# Patient Record
Sex: Male | Born: 1960 | Race: White | Hispanic: No | Marital: Married | Smoking: Never smoker
Health system: Southern US, Community
[De-identification: ages and names within clinical notes are randomized; demographics above are authoritative.]

## PROBLEM LIST (undated history)

## (undated) DIAGNOSIS — I10 Essential (primary) hypertension: Secondary | ICD-10-CM

## (undated) DIAGNOSIS — I2510 Atherosclerotic heart disease of native coronary artery without angina pectoris: Secondary | ICD-10-CM

## (undated) DIAGNOSIS — I219 Acute myocardial infarction, unspecified: Secondary | ICD-10-CM

## (undated) DIAGNOSIS — I639 Cerebral infarction, unspecified: Secondary | ICD-10-CM

## (undated) DIAGNOSIS — I469 Cardiac arrest, cause unspecified: Secondary | ICD-10-CM

## (undated) DIAGNOSIS — M109 Gout, unspecified: Secondary | ICD-10-CM

## (undated) HISTORY — PX: BYPASS GRAFT: SHX909

---

## 2015-01-04 ENCOUNTER — Encounter (HOSPITAL_COMMUNITY): Payer: Self-pay | Admitting: Emergency Medicine

## 2015-01-04 ENCOUNTER — Observation Stay (HOSPITAL_COMMUNITY): Payer: Managed Care, Other (non HMO)

## 2015-01-04 ENCOUNTER — Observation Stay (HOSPITAL_COMMUNITY)
Admission: EM | Admit: 2015-01-04 | Discharge: 2015-01-10 | Disposition: A | Discharge status: Home / Self Care | Payer: Managed Care, Other (non HMO) | Attending: Internal Medicine | Admitting: Internal Medicine

## 2015-01-04 ENCOUNTER — Emergency Department (HOSPITAL_COMMUNITY): Payer: Managed Care, Other (non HMO)

## 2015-01-04 DIAGNOSIS — N19 Unspecified kidney failure: Secondary | ICD-10-CM | POA: Diagnosis not present

## 2015-01-04 DIAGNOSIS — E785 Hyperlipidemia, unspecified: Secondary | ICD-10-CM | POA: Insufficient documentation

## 2015-01-04 DIAGNOSIS — I639 Cerebral infarction, unspecified: Secondary | ICD-10-CM | POA: Diagnosis present

## 2015-01-04 DIAGNOSIS — I10 Essential (primary) hypertension: Secondary | ICD-10-CM | POA: Diagnosis present

## 2015-01-04 DIAGNOSIS — Z8674 Personal history of sudden cardiac arrest: Secondary | ICD-10-CM | POA: Insufficient documentation

## 2015-01-04 DIAGNOSIS — G40209 Localization-related (focal) (partial) symptomatic epilepsy and epileptic syndromes with complex partial seizures, not intractable, without status epilepticus: Secondary | ICD-10-CM | POA: Diagnosis not present

## 2015-01-04 DIAGNOSIS — Z6835 Body mass index (BMI) 35.0-35.9, adult: Secondary | ICD-10-CM | POA: Diagnosis not present

## 2015-01-04 DIAGNOSIS — Z951 Presence of aortocoronary bypass graft: Secondary | ICD-10-CM | POA: Insufficient documentation

## 2015-01-04 DIAGNOSIS — G459 Transient cerebral ischemic attack, unspecified: Principal | ICD-10-CM | POA: Diagnosis present

## 2015-01-04 DIAGNOSIS — R931 Abnormal findings on diagnostic imaging of heart and coronary circulation: Secondary | ICD-10-CM | POA: Insufficient documentation

## 2015-01-04 DIAGNOSIS — R4701 Aphasia: Secondary | ICD-10-CM | POA: Diagnosis present

## 2015-01-04 DIAGNOSIS — E669 Obesity, unspecified: Secondary | ICD-10-CM | POA: Diagnosis not present

## 2015-01-04 DIAGNOSIS — I252 Old myocardial infarction: Secondary | ICD-10-CM | POA: Insufficient documentation

## 2015-01-04 DIAGNOSIS — J329 Chronic sinusitis, unspecified: Secondary | ICD-10-CM | POA: Diagnosis present

## 2015-01-04 DIAGNOSIS — Z8249 Family history of ischemic heart disease and other diseases of the circulatory system: Secondary | ICD-10-CM | POA: Insufficient documentation

## 2015-01-04 DIAGNOSIS — I2510 Atherosclerotic heart disease of native coronary artery without angina pectoris: Secondary | ICD-10-CM | POA: Diagnosis not present

## 2015-01-04 DIAGNOSIS — I509 Heart failure, unspecified: Secondary | ICD-10-CM | POA: Diagnosis not present

## 2015-01-04 DIAGNOSIS — I420 Dilated cardiomyopathy: Secondary | ICD-10-CM | POA: Diagnosis not present

## 2015-01-04 DIAGNOSIS — Z79899 Other long term (current) drug therapy: Secondary | ICD-10-CM | POA: Insufficient documentation

## 2015-01-04 DIAGNOSIS — G40219 Localization-related (focal) (partial) symptomatic epilepsy and epileptic syndromes with complex partial seizures, intractable, without status epilepticus: Secondary | ICD-10-CM | POA: Insufficient documentation

## 2015-01-04 DIAGNOSIS — Z791 Long term (current) use of non-steroidal anti-inflammatories (NSAID): Secondary | ICD-10-CM | POA: Diagnosis not present

## 2015-01-04 HISTORY — DX: Acute myocardial infarction, unspecified: I21.9

## 2015-01-04 HISTORY — DX: Atherosclerotic heart disease of native coronary artery without angina pectoris: I25.10

## 2015-01-04 HISTORY — DX: Essential (primary) hypertension: I10

## 2015-01-04 HISTORY — DX: Cardiac arrest, cause unspecified: I46.9

## 2015-01-04 LAB — COMPREHENSIVE METABOLIC PANEL
ALBUMIN: 3.8 g/dL (ref 3.5–5.0)
ALT: 51 U/L (ref 17–63)
AST: 35 U/L (ref 15–41)
Alkaline Phosphatase: 131 U/L — ABNORMAL HIGH (ref 38–126)
Anion gap: 12 (ref 5–15)
BUN: 24 mg/dL — AB (ref 6–20)
CO2: 26 mmol/L (ref 22–32)
Calcium: 9.5 mg/dL (ref 8.9–10.3)
Chloride: 98 mmol/L — ABNORMAL LOW (ref 101–111)
Creatinine, Ser: 1.49 mg/dL — ABNORMAL HIGH (ref 0.61–1.24)
GFR calc Af Amer: 60 mL/min — ABNORMAL LOW (ref >60)
GFR calc non Af Amer: 52 mL/min — ABNORMAL LOW (ref >60)
Glucose, Bld: 121 mg/dL — ABNORMAL HIGH (ref 65–99)
Potassium: 4.2 mmol/L (ref 3.5–5.1)
Sodium: 136 mmol/L (ref 135–145)
Total Bilirubin: 1.2 mg/dL (ref 0.3–1.2)
Total Protein: 7.4 g/dL (ref 6.5–8.1)

## 2015-01-04 LAB — I-STAT CHEM 8, ED
BUN: 35 mg/dL — ABNORMAL HIGH (ref 6–20)
CALCIUM ION: 1.02 mmol/L — AB (ref 1.12–1.23)
CHLORIDE: 99 mmol/L — AB (ref 101–111)
Creatinine, Ser: 1.4 mg/dL — ABNORMAL HIGH (ref 0.61–1.24)
GLUCOSE: 121 mg/dL — AB (ref 65–99)
HCT: 56 % — ABNORMAL HIGH (ref 39.0–52.0)
Hemoglobin: 19 g/dL — ABNORMAL HIGH (ref 13.0–17.0)
Potassium: 4.4 mmol/L (ref 3.5–5.1)
Sodium: 137 mmol/L (ref 135–145)
TCO2: 26 mmol/L (ref 0–100)

## 2015-01-04 LAB — DIFFERENTIAL
BASOS ABS: 0 10*3/uL (ref 0.0–0.1)
Basophils Relative: 0 % (ref 0–1)
Eosinophils Absolute: 0.2 10*3/uL (ref 0.0–0.7)
Eosinophils Relative: 3 % (ref 0–5)
LYMPHS PCT: 27 % (ref 12–46)
Lymphs Abs: 1.9 10*3/uL (ref 0.7–4.0)
MONOS PCT: 8 % (ref 3–12)
Monocytes Absolute: 0.5 10*3/uL (ref 0.1–1.0)
NEUTROS ABS: 4.4 10*3/uL (ref 1.7–7.7)
Neutrophils Relative %: 62 % (ref 43–77)

## 2015-01-04 LAB — PROTIME-INR
INR: 1.12 (ref 0.00–1.49)
PROTHROMBIN TIME: 14.6 s (ref 11.6–15.2)

## 2015-01-04 LAB — CBC
HCT: 50.8 % (ref 39.0–52.0)
HEMOGLOBIN: 17.5 g/dL — AB (ref 13.0–17.0)
MCH: 30.5 pg (ref 26.0–34.0)
MCHC: 34.4 g/dL (ref 30.0–36.0)
MCV: 88.5 fL (ref 78.0–100.0)
Platelets: 308 10*3/uL (ref 150–400)
RBC: 5.74 MIL/uL (ref 4.22–5.81)
RDW: 13.3 % (ref 11.5–15.5)
WBC: 7 10*3/uL (ref 4.0–10.5)

## 2015-01-04 LAB — APTT: APTT: 30 s (ref 24–37)

## 2015-01-04 LAB — CBG MONITORING, ED: Glucose-Capillary: 119 mg/dL — ABNORMAL HIGH (ref 65–99)

## 2015-01-04 LAB — I-STAT TROPONIN, ED: Troponin i, poc: 0 ng/mL (ref 0.00–0.08)

## 2015-01-04 LAB — I-STAT CG4 LACTIC ACID, ED: LACTIC ACID, VENOUS: 1.82 mmol/L (ref 0.5–2.0)

## 2015-01-04 MED ORDER — CLOPIDOGREL BISULFATE 300 MG PO TABS
300.0000 mg | ORAL_TABLET | Freq: Every day | ORAL | Status: DC
  Administered 2015-01-04: 300 mg via ORAL

## 2015-01-04 MED ORDER — SODIUM CHLORIDE 0.9 % IV SOLN
INTRAVENOUS | Status: AC
  Administered 2015-01-05: via INTRAVENOUS

## 2015-01-04 MED ORDER — CLOPIDOGREL BISULFATE 75 MG PO TABS
75.0000 mg | ORAL_TABLET | Freq: Every day | ORAL | Status: DC
  Administered 2015-01-05 – 2015-01-10 (×6): 75 mg via ORAL
  Filled 2015-01-04 (×6): qty 1

## 2015-01-04 MED ORDER — ASPIRIN EC 325 MG PO TBEC
325.0000 mg | DELAYED_RELEASE_TABLET | Freq: Every day | ORAL | Status: DC
  Administered 2015-01-04 – 2015-01-10 (×7): 325 mg via ORAL
  Filled 2015-01-04 (×6): qty 1

## 2015-01-04 MED ORDER — LISINOPRIL 10 MG PO TABS
20.0000 mg | ORAL_TABLET | Freq: Every day | ORAL | Status: DC
  Administered 2015-01-05 – 2015-01-10 (×5): 20 mg via ORAL
  Filled 2015-01-04 (×2): qty 1
  Filled 2015-01-04: qty 2
  Filled 2015-01-04 (×2): qty 1
  Filled 2015-01-04: qty 2

## 2015-01-04 MED ORDER — ASPIRIN EC 325 MG PO TBEC
325.0000 mg | DELAYED_RELEASE_TABLET | Freq: Every day | ORAL | Status: DC
  Filled 2015-01-04: qty 1

## 2015-01-04 MED ORDER — SENNOSIDES-DOCUSATE SODIUM 8.6-50 MG PO TABS: 1.0000 | ORAL_TABLET | Freq: Every evening | ORAL | Status: DC | PRN

## 2015-01-04 MED ORDER — CLOPIDOGREL BISULFATE 300 MG PO TABS
300.0000 mg | ORAL_TABLET | Freq: Every day | ORAL | Status: DC
  Filled 2015-01-04: qty 1

## 2015-01-04 MED ORDER — ENOXAPARIN SODIUM 40 MG/0.4ML ~~LOC~~ SOLN
40.0000 mg | SUBCUTANEOUS | Status: DC
  Administered 2015-01-05 – 2015-01-08 (×5): 40 mg via SUBCUTANEOUS
  Filled 2015-01-04 (×6): qty 0.4

## 2015-01-04 MED ORDER — STROKE: EARLY STAGES OF RECOVERY BOOK
Freq: Once | Status: AC
Start: 2015-01-05 — End: 2015-01-05
  Administered 2015-01-05

## 2015-01-04 MED ORDER — IOHEXOL 350 MG/ML SOLN
80.0000 mL | Freq: Once | INTRAVENOUS | Status: AC | PRN
  Administered 2015-01-04: 100 mL via INTRAVENOUS

## 2015-01-04 MED ORDER — CLOPIDOGREL BISULFATE 300 MG PO TABS: 300.0000 mg | ORAL_TABLET | Freq: Once | ORAL | Status: AC

## 2015-01-04 NOTE — ED Notes (Signed)
Pt transported to MRI with Nehemiah Settle, RRT

## 2015-01-04 NOTE — H&P (Signed)
[facility] History and Physical  Darrell Knapp ZOX:[id number] DOB: 07-29-1960 DOA: 01/04/2015  Referring physician: ER physician. PCP: No primary care provider on file. patient is visiting from [place]. Specialists: None.  Chief Complaint: Right-sided weakness with aphasia and confusion.  HPI: Darrell Knapp is a 54 y.o. male with history of CAD status post CABG, cardiac arrest and hypertension was brought to the ER after patient was found to have right-sided weakness with aphasia and confusion which lasted for around 10 minutes. It happened around last evening 4 PM. On the way to the hospital patient also had a similar episode again which resolved without any intervention. Patient had an episode later again. CT head MRI brain and CT angiogram of the head and neck are unremarkable. Neurologist has seen patient in consult and patient was given loading dose of Plavix along with aspirin. Patient states he was recently started on lisinopril and HCTZ for his hypertension 3 weeks ago when patient had some chest discomfort. Presently has no chest discomfort. Denies any shortness of breath. Patient otherwise denies any visual symptoms difficulty swallowing. On exam patient is able to move all extremities.   Review of Systems: As presented in the history of presenting illness, rest negative.  Past Medical History  Diagnosis Date  . Coronary artery disease   . Cardiac arrest   . Heart attack   . Hypertension    Past Surgical History  Procedure Laterality Date  . Bypass graft     Social History:  reports that he has never smoked. He does not have any smokeless tobacco history on file. He reports that he does not drink alcohol. His drug history is not on file. Where does patient live home.  Can patient participate in ADLs? Yes.  No Known Allergies  Family History:  Family History  Problem Relation Age of Onset  . Stroke Mother   . Heart attack Father   . Kidney failure Brother        Prior to Admission medications   Medication Sig Start Date End Date Taking? Authorizing Provider  lisinopril-hydrochlorothiazide (PRINZIDE,ZESTORETIC) 20-25 MG per tablet Take 1 tablet by mouth daily. 12/16/14  Yes Historical Provider, MD    Physical Exam: Filed Vitals:   01/04/15 2000 01/04/15 2009 01/04/15 2138 01/04/15 2300  BP: 158/105  138/98   Pulse: 98  95   Temp:  98.4 F (36.9 C)    TempSrc:   Oral   Resp: 17  18   Height:    5\' 10"  (1.778 m)  Weight:    114.896 kg (253 lb 4.8 oz)  SpO2: 93%  98%      General:  Moderately built and nourished.  Eyes: Anicteric no pallor.  ENT: No discharge from the ears eyes nose and mouth.  Neck: No mass felt. No neck rigidity.  Cardiovascular: S1-S2 heard.  Respiratory: No rhonchi or crepitations.  Abdomen: Soft nontender bowel sounds present.  Skin: No rash.  Musculoskeletal: No edema.  Psychiatric: Appears normal.  Neurologic: Alert awake oriented to time place and person. Moves all extremities 5 x 5. No facial asymmetry. Tongue is midline.  Labs on Admission:  Basic Metabolic Panel:  Recent Labs Lab 01/04/15 1752 01/04/15 1800  NA 137 136  K 4.4 4.2  CL 99* 98*  CO2  --  26  GLUCOSE 121* 121*  BUN 35* 24*  CREATININE 1.40* 1.49*  CALCIUM  --  9.5   Liver Function Tests:  Recent Labs Lab 01/04/15 1800  AST 35  ALT 51  ALKPHOS 131*  BILITOT 1.2  PROT 7.4  ALBUMIN 3.8   No results for input(s): LIPASE, AMYLASE in the last 168 hours. No results for input(s): AMMONIA in the last 168 hours. CBC:  Recent Labs Lab 01/04/15 1752 01/04/15 1800  WBC  --  7.0  NEUTROABS  --  4.4  HGB 19.0* 17.5*  HCT 56.0* 50.8  MCV  --  88.5  PLT  --  308   Cardiac Enzymes: No results for input(s): CKTOTAL, CKMB, CKMBINDEX, TROPONINI in the last 168 hours.  BNP (last 3 results) No results for input(s): BNP in the last 8760 hours.  ProBNP (last 3 results) No results for input(s): PROBNP in the last 8760  hours.  CBG:  Recent Labs Lab 01/04/15 1831  GLUCAP 119*    Radiological Exams on Admission: Ct Angio Head W/cm &/or Wo Cm  01/04/2015   CLINICAL DATA:  Episode lasting 45 seconds with acute onset weakness in the right upper and lower extremities. Persistent intermittent aphasia  EXAM: CT ANGIOGRAPHY HEAD AND NECK  TECHNIQUE: Multidetector CT imaging of the head and neck was performed using the standard protocol during bolus administration of intravenous contrast. Multiplanar CT image reconstructions and MIPs were obtained to evaluate the vascular anatomy. Carotid stenosis measurements (when applicable) are obtained utilizing NASCET criteria, using the distal internal carotid diameter as the denominator.  CONTRAST:  OMNIPAQUE IOHEXOL 350 MG/ML SOLN  COMPARISON:  CT head without contrast from the same day.  FINDINGS: CT HEAD  Brain: The source images demonstrate no evidence for acute or subacute infarction. Basal ganglia are intact. The insular ribbon is intact. No acute cortical infarct is present.  Calvarium and skull base: Within normal limits.  Paranasal sinuses: Mild mucosal thickening is present in the frontal sinuses bilaterally. The paranasal sinuses and mastoid air cells are otherwise clear.  Orbits: Negative  CTA NECK  Aortic arch: A common origin of the left common carotid artery and innominate artery is noted. The study is mildly degraded by patient motion.  Right carotid system: The right common carotid artery is within normal limits. Atherosclerotic calcifications are present at the origin of the right internal carotid artery without significant stenosis. The cervical right ICA is normal.  Left carotid system: The left common carotid artery is within normal limits. Atherosclerotic changes are present at the carotid bifurcation without a significant stenosis relative to the more distal vessel. The more distal cervical ICA is normal.  Vertebral arteries:The vertebral arteries originate  from of the subclavian arteries bilaterally. Mild to moderate narrowing is present at proximal left vertebral artery. No focal stenosis or vascular injury is evident within the vertebral arteries.  Skeleton: Mild endplate degenerative changes are present from C3-4 through C5-6. Uncovertebral spurring is worse left than right at C3-4 and C5-6. No focal lytic or blastic lesions are present.  Other neck: The soft tissues of the neck demonstrate calcifications in the palatine tonsils bilaterally compatible with the history of infection. Lymph nodes are present in the parotid glands bilaterally. Reactive size level 2 and level 3 lymph nodes are present bilaterally. No significant cervical adenopathy is present.  The lung apices are clear  CTA HEAD  Anterior circulation: Minimal atherosclerotic changes are present within the cavernous internal carotid arteries bilaterally without significant stenosis through the ICA termini. The A1 and M1 segments are normal. The MCA bifurcations are intact. There is some attenuation of distal MCA branch vessels, worse on the right.  Posterior circulation: The  vertebral arteries are codominant. PICA origins are visualized and normal bilaterally. A fetal type left posterior cerebral artery is present. The right posterior cerebral artery originates from the basilar tip.  Venous sinuses: The dural sinuses are patent. The straight sinus and deep cerebral veins are intact. Cortical veins are within normal limits.  Anatomic variants: Fetal type left posterior cerebral artery.  Delayed phase: The postcontrast images demonstrate no pathologic enhancement.  IMPRESSION: 1. White matter disease without evidence for acute cortical infarct. 2. Mild attenuation of MCA branch vessels, right greater than left. 3. No significant proximal stenosis, aneurysm, or branch vessel occlusion. 4. Mild atherosclerotic changes within the carotid bifurcations bilaterally without significant stenosis. 5. Degenerative  changes in the cervical spine.   Electronically Signed   By: Marin Roberts M.D.   On: 01/04/2015 21:33   Ct Head Wo Contrast  01/04/2015   CLINICAL DATA:  Right-sided weakness and aphasia.  Code stroke.  EXAM: CT HEAD WITHOUT CONTRAST  TECHNIQUE: Contiguous axial images were obtained from the base of the skull through the vertex without intravenous contrast.  COMPARISON:  None.  FINDINGS: No acute cortical infarct, hemorrhage, or mass lesion is present. The ventricles are of normal size. No significant extra-axial fluid collection is evident. Focal opacity in the superior left frontal lobe appears separated by septation. There is surrounding reactive bone change. Paranasal sinuses are otherwise clear. The mastoid air cells are clear.  ASPECTS score = 10/10  [place] Stroke Program Early CT Score  Normal score = 10  IMPRESSION: 1. Normal CT appearance the brain. 2. Opacification of the superior aspect of the left frontal sinus with surrounding reactive bone change. This appears to be a chronic process. Recommend correlation with the patient's symptoms.  These results were called by telephone at the time of interpretation on 01/04/2015 at 6:17 pm to Dr. Noel Christmas , who verbally acknowledged these results.   Electronically Signed   By: Marin Roberts M.D.   On: 01/04/2015 18:29   Ct Angio Neck W/cm &/or Wo/cm  01/04/2015   CLINICAL DATA:  Episode lasting 45 seconds with acute onset weakness in the right upper and lower extremities. Persistent intermittent aphasia  EXAM: CT ANGIOGRAPHY HEAD AND NECK  TECHNIQUE: Multidetector CT imaging of the head and neck was performed using the standard protocol during bolus administration of intravenous contrast. Multiplanar CT image reconstructions and MIPs were obtained to evaluate the vascular anatomy. Carotid stenosis measurements (when applicable) are obtained utilizing NASCET criteria, using the distal internal carotid diameter as the denominator.   CONTRAST:  OMNIPAQUE IOHEXOL 350 MG/ML SOLN  COMPARISON:  CT head without contrast from the same day.  FINDINGS: CT HEAD  Brain: The source images demonstrate no evidence for acute or subacute infarction. Basal ganglia are intact. The insular ribbon is intact. No acute cortical infarct is present.  Calvarium and skull base: Within normal limits.  Paranasal sinuses: Mild mucosal thickening is present in the frontal sinuses bilaterally. The paranasal sinuses and mastoid air cells are otherwise clear.  Orbits: Negative  CTA NECK  Aortic arch: A common origin of the left common carotid artery and innominate artery is noted. The study is mildly degraded by patient motion.  Right carotid system: The right common carotid artery is within normal limits. Atherosclerotic calcifications are present at the origin of the right internal carotid artery without significant stenosis. The cervical right ICA is normal.  Left carotid system: The left common carotid artery is within normal limits. Atherosclerotic changes  are present at the carotid bifurcation without a significant stenosis relative to the more distal vessel. The more distal cervical ICA is normal.  Vertebral arteries:The vertebral arteries originate from of the subclavian arteries bilaterally. Mild to moderate narrowing is present at proximal left vertebral artery. No focal stenosis or vascular injury is evident within the vertebral arteries.  Skeleton: Mild endplate degenerative changes are present from C3-4 through C5-6. Uncovertebral spurring is worse left than right at C3-4 and C5-6. No focal lytic or blastic lesions are present.  Other neck: The soft tissues of the neck demonstrate calcifications in the palatine tonsils bilaterally compatible with the history of infection. Lymph nodes are present in the parotid glands bilaterally. Reactive size level 2 and level 3 lymph nodes are present bilaterally. No significant cervical adenopathy is present.  The lung  apices are clear  CTA HEAD  Anterior circulation: Minimal atherosclerotic changes are present within the cavernous internal carotid arteries bilaterally without significant stenosis through the ICA termini. The A1 and M1 segments are normal. The MCA bifurcations are intact. There is some attenuation of distal MCA branch vessels, worse on the right.  Posterior circulation: The vertebral arteries are codominant. PICA origins are visualized and normal bilaterally. A fetal type left posterior cerebral artery is present. The right posterior cerebral artery originates from the basilar tip.  Venous sinuses: The dural sinuses are patent. The straight sinus and deep cerebral veins are intact. Cortical veins are within normal limits.  Anatomic variants: Fetal type left posterior cerebral artery.  Delayed phase: The postcontrast images demonstrate no pathologic enhancement.  IMPRESSION: 1. White matter disease without evidence for acute cortical infarct. 2. Mild attenuation of MCA branch vessels, right greater than left. 3. No significant proximal stenosis, aneurysm, or branch vessel occlusion. 4. Mild atherosclerotic changes within the carotid bifurcations bilaterally without significant stenosis. 5. Degenerative changes in the cervical spine.   Electronically Signed   By: Marin Roberts M.D.   On: 01/04/2015 21:33   Mr Brain Wo Contrast  01/04/2015   CLINICAL DATA:  Acute onset of intermittent aphasia. Episode of right hemiparesis.  EXAM: MRI HEAD WITHOUT CONTRAST  TECHNIQUE: Multiplanar, multiecho pulse sequences of the brain and surrounding structures were obtained without intravenous contrast.  COMPARISON:  CTA head and neck from the same day.  FINDINGS: The diffusion-weighted images demonstrate no evidence for acute or subacute infarction. Mild atrophy and white matter change is slightly advanced for age.  The ventricles are of normal size. A remote left temporal parietal lobe infarct is noted. No significant  extraaxial fluid collection is present.  Flow is present in the major intracranial arteries. The globes and orbits are intact. Mild mucosal thickening is present in the anterior ethmoid air cells and frontal sinuses. A sequestered area in the superior left frontal sinus is opacified with osseous changes overlying this area, likely reactive.  The skullbase is within normal limits. Midline structures are unremarkable.  IMPRESSION: 1. No acute intracranial abnormality. 2. Age advanced atrophy and white matter disease. 3. 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 4. Frontal sinus disease.   Electronically Signed   By: Marin Roberts M.D.   On: 01/04/2015 21:36    EKG: Independently reviewed. Sinus tachycardia with RBBB.  Assessment/Plan Principal Problem:   TIA (transient ischemic attack) Active Problems:   Hypertension   CAD (coronary artery disease)   Renal failure   1. TIA - appreciate neurology consult. MRI of the brain and CT angiogram of the head and  neck are unremarkable. Patient will be placed on neurochecks swallow evaluation. Patient is on both aspirin and Plavix as recommended by neurologist. Check hemoglobin A1c and lipid panel. 2. Hypertension - continue lisinopril hold HCTZ as patient is receiving gentle hydration. 3. History of CAD status post CABG - denies any chest pain. On aspirin and Plavix presently. 4. Mild renal failure - baseline creatinine not known. Follow metabolic panel.  I have reviewed neurologist notes and recommendations.  DVT PLovenox.  Code Status:  Full code.  Family Communication:  Discussed with patient.  Disposition Plan:  Admit for observation.    Lynnann Knudsen N. [facility] Pager [phone].  If 7PM-7AM, please contact night-coverage [email or web address] Password [email or web address] 01/04/2015, 11:58 PM

## 2015-01-04 NOTE — ED Notes (Signed)
Neurology and stroke team at bedside. 

## 2015-01-04 NOTE — ED Notes (Signed)
Pt returned from MRI. Spoke with Dr. Amada Jupiter, pt reset the clock, returned to normal. NIH is 0. Repage out code stroke if new neuro symptoms, q2H neuro checks.

## 2015-01-04 NOTE — ED Provider Notes (Signed)
CSN: [id number]     Arrival date & time 01/04/15  1743 History   First MD Initiated Contact with Patient 01/04/15 1750     Chief Complaint  Patient presents with  . Code Stroke    An emergency department physician performed an initial assessment on this suspected stroke patient at 14. (Consider location/radiation/quality/duration/timing/severity/associated sxs/prior Treatment) Patient is a 54 y.o. male presenting with Acute Neurological Problem. The history is provided by the patient.  Cerebrovascular Accident This is a new problem. The current episode started 1 to 2 hours ago. The problem occurs constantly. The problem has been resolved (waxing and waning, 3 episodes total). Pertinent negatives include no chest pain and no shortness of breath. Associated symptoms comments: Right arm weakness, trembling of left hand, speech difficulty/word finding difficulty. Nothing aggravates the symptoms. Nothing relieves the symptoms. He has tried nothing for the symptoms.    Past Medical History  Diagnosis Date  . Coronary artery disease   . Cardiac arrest   . Heart attack   . Hypertension    Past Surgical History  Procedure Laterality Date  . Bypass graft     No family history on file. Social History  Substance Use Topics  . Smoking status: Never Smoker   . Smokeless tobacco: None  . Alcohol Use: No    Review of Systems  Respiratory: Negative for shortness of breath.   Cardiovascular: Negative for chest pain.  All other systems reviewed and are negative.     Allergies  Review of patient's allergies indicates no known allergies.  Home Medications   Prior to Admission medications   Not on File   BP 150/102 mmHg  Pulse 95  Temp(Src) 98.6 F (37 C) (Oral)  Resp 25  Wt 259 lb 11.2 oz (117.8 kg)  SpO2 98% Physical Exam  Constitutional: He is oriented to person, place, and time. He appears well-developed and well-nourished. No distress.  HENT:  Head: Normocephalic and  atraumatic.  Eyes: Conjunctivae are normal.  Neck: Neck supple. No tracheal deviation present.  Cardiovascular: Normal rate, regular rhythm and normal heart sounds.   Pulmonary/Chest: Effort normal and breath sounds normal. No respiratory distress.  Abdominal: Soft. He exhibits no distension.  Neurological: He is alert and oriented to person, place, and time. He has normal strength. No cranial nerve deficit. Coordination normal. GCS eye subscore is 4. GCS verbal subscore is 5. GCS motor subscore is 6.  Skin: Skin is warm and dry.  Psychiatric: He has a normal mood and affect. His speech is normal and behavior is normal.    ED Course  Procedures (including critical care time) Labs Review Labs Reviewed  CBC - Abnormal; Notable for the following:    Hemoglobin 17.5 (*)    All other components within normal limits  COMPREHENSIVE METABOLIC PANEL - Abnormal; Notable for the following:    Chloride 98 (*)    Glucose, Bld 121 (*)    BUN 24 (*)    Creatinine, Ser 1.49 (*)    Alkaline Phosphatase 131 (*)    GFR calc non Af Amer 52 (*)    GFR calc Af Amer 60 (*)    All other components within normal limits  CBG MONITORING, ED - Abnormal; Notable for the following:    Glucose-Capillary 119 (*)    All other components within normal limits  I-STAT CHEM 8, ED - Abnormal; Notable for the following:    Chloride 99 (*)    BUN 35 (*)    Creatinine,  Ser 1.40 (*)    Glucose, Bld 121 (*)    Calcium, Ion 1.02 (*)    Hemoglobin 19.0 (*)    HCT 56.0 (*)    All other components within normal limits  PROTIME-INR  APTT  DIFFERENTIAL  I-STAT TROPOININ, ED  I-STAT CG4 LACTIC ACID, ED    Imaging Review Ct Angio Head W/cm &/or Wo Cm  01/04/2015   CLINICAL DATA:  Episode lasting 45 seconds with acute onset weakness in the right upper and lower extremities. Persistent intermittent aphasia  EXAM: CT ANGIOGRAPHY HEAD AND NECK  TECHNIQUE: Multidetector CT imaging of the head and neck was performed using  the standard protocol during bolus administration of intravenous contrast. Multiplanar CT image reconstructions and MIPs were obtained to evaluate the vascular anatomy. Carotid stenosis measurements (when applicable) are obtained utilizing NASCET criteria, using the distal internal carotid diameter as the denominator.  CONTRAST:  OMNIPAQUE IOHEXOL 350 MG/ML SOLN  COMPARISON:  CT head without contrast from the same day.  FINDINGS: CT HEAD  Brain: The source images demonstrate no evidence for acute or subacute infarction. Basal ganglia are intact. The insular ribbon is intact. No acute cortical infarct is present.  Calvarium and skull base: Within normal limits.  Paranasal sinuses: Mild mucosal thickening is present in the frontal sinuses bilaterally. The paranasal sinuses and mastoid air cells are otherwise clear.  Orbits: Negative  CTA NECK  Aortic arch: A common origin of the left common carotid artery and innominate artery is noted. The study is mildly degraded by patient motion.  Right carotid system: The right common carotid artery is within normal limits. Atherosclerotic calcifications are present at the origin of the right internal carotid artery without significant stenosis. The cervical right ICA is normal.  Left carotid system: The left common carotid artery is within normal limits. Atherosclerotic changes are present at the carotid bifurcation without a significant stenosis relative to the more distal vessel. The more distal cervical ICA is normal.  Vertebral arteries:The vertebral arteries originate from of the subclavian arteries bilaterally. Mild to moderate narrowing is present at proximal left vertebral artery. No focal stenosis or vascular injury is evident within the vertebral arteries.  Skeleton: Mild endplate degenerative changes are present from C3-4 through C5-6. Uncovertebral spurring is worse left than right at C3-4 and C5-6. No focal lytic or blastic lesions are present.  Other neck:  The soft tissues of the neck demonstrate calcifications in the palatine tonsils bilaterally compatible with the history of infection. Lymph nodes are present in the parotid glands bilaterally. Reactive size level 2 and level 3 lymph nodes are present bilaterally. No significant cervical adenopathy is present.  The lung apices are clear  CTA HEAD  Anterior circulation: Minimal atherosclerotic changes are present within the cavernous internal carotid arteries bilaterally without significant stenosis through the ICA termini. The A1 and M1 segments are normal. The MCA bifurcations are intact. There is some attenuation of distal MCA branch vessels, worse on the right.  Posterior circulation: The vertebral arteries are codominant. PICA origins are visualized and normal bilaterally. A fetal type left posterior cerebral artery is present. The right posterior cerebral artery originates from the basilar tip.  Venous sinuses: The dural sinuses are patent. The straight sinus and deep cerebral veins are intact. Cortical veins are within normal limits.  Anatomic variants: Fetal type left posterior cerebral artery.  Delayed phase: The postcontrast images demonstrate no pathologic enhancement.  IMPRESSION: 1. White matter disease without evidence for acute cortical infarct.  2. Mild attenuation of MCA branch vessels, right greater than left. 3. No significant proximal stenosis, aneurysm, or branch vessel occlusion. 4. Mild atherosclerotic changes within the carotid bifurcations bilaterally without significant stenosis. 5. Degenerative changes in the cervical spine.   Electronically Signed   By: Marin Roberts M.D.   On: 01/04/2015 21:33   Ct Head Wo Contrast  01/04/2015   CLINICAL DATA:  Right-sided weakness and aphasia.  Code stroke.  EXAM: CT HEAD WITHOUT CONTRAST  TECHNIQUE: Contiguous axial images were obtained from the base of the skull through the vertex without intravenous contrast.  COMPARISON:  None.  FINDINGS: No  acute cortical infarct, hemorrhage, or mass lesion is present. The ventricles are of normal size. No significant extra-axial fluid collection is evident. Focal opacity in the superior left frontal lobe appears separated by septation. There is surrounding reactive bone change. Paranasal sinuses are otherwise clear. The mastoid air cells are clear.  ASPECTS score = 10/10  [place] Stroke Program Early CT Score  Normal score = 10  IMPRESSION: 1. Normal CT appearance the brain. 2. Opacification of the superior aspect of the left frontal sinus with surrounding reactive bone change. This appears to be a chronic process. Recommend correlation with the patient's symptoms.  These results were called by telephone at the time of interpretation on 01/04/2015 at 6:17 pm to Dr. Noel Christmas , who verbally acknowledged these results.   Electronically Signed   By: Marin Roberts M.D.   On: 01/04/2015 18:29   Ct Angio Neck W/cm &/or Wo/cm  01/04/2015   CLINICAL DATA:  Episode lasting 45 seconds with acute onset weakness in the right upper and lower extremities. Persistent intermittent aphasia  EXAM: CT ANGIOGRAPHY HEAD AND NECK  TECHNIQUE: Multidetector CT imaging of the head and neck was performed using the standard protocol during bolus administration of intravenous contrast. Multiplanar CT image reconstructions and MIPs were obtained to evaluate the vascular anatomy. Carotid stenosis measurements (when applicable) are obtained utilizing NASCET criteria, using the distal internal carotid diameter as the denominator.  CONTRAST:  OMNIPAQUE IOHEXOL 350 MG/ML SOLN  COMPARISON:  CT head without contrast from the same day.  FINDINGS: CT HEAD  Brain: The source images demonstrate no evidence for acute or subacute infarction. Basal ganglia are intact. The insular ribbon is intact. No acute cortical infarct is present.  Calvarium and skull base: Within normal limits.  Paranasal sinuses: Mild mucosal thickening is present in  the frontal sinuses bilaterally. The paranasal sinuses and mastoid air cells are otherwise clear.  Orbits: Negative  CTA NECK  Aortic arch: A common origin of the left common carotid artery and innominate artery is noted. The study is mildly degraded by patient motion.  Right carotid system: The right common carotid artery is within normal limits. Atherosclerotic calcifications are present at the origin of the right internal carotid artery without significant stenosis. The cervical right ICA is normal.  Left carotid system: The left common carotid artery is within normal limits. Atherosclerotic changes are present at the carotid bifurcation without a significant stenosis relative to the more distal vessel. The more distal cervical ICA is normal.  Vertebral arteries:The vertebral arteries originate from of the subclavian arteries bilaterally. Mild to moderate narrowing is present at proximal left vertebral artery. No focal stenosis or vascular injury is evident within the vertebral arteries.  Skeleton: Mild endplate degenerative changes are present from C3-4 through C5-6. Uncovertebral spurring is worse left than right at C3-4 and C5-6. No focal lytic  or blastic lesions are present.  Other neck: The soft tissues of the neck demonstrate calcifications in the palatine tonsils bilaterally compatible with the history of infection. Lymph nodes are present in the parotid glands bilaterally. Reactive size level 2 and level 3 lymph nodes are present bilaterally. No significant cervical adenopathy is present.  The lung apices are clear  CTA HEAD  Anterior circulation: Minimal atherosclerotic changes are present within the cavernous internal carotid arteries bilaterally without significant stenosis through the ICA termini. The A1 and M1 segments are normal. The MCA bifurcations are intact. There is some attenuation of distal MCA branch vessels, worse on the right.  Posterior circulation: The vertebral arteries are codominant.  PICA origins are visualized and normal bilaterally. A fetal type left posterior cerebral artery is present. The right posterior cerebral artery originates from the basilar tip.  Venous sinuses: The dural sinuses are patent. The straight sinus and deep cerebral veins are intact. Cortical veins are within normal limits.  Anatomic variants: Fetal type left posterior cerebral artery.  Delayed phase: The postcontrast images demonstrate no pathologic enhancement.  IMPRESSION: 1. White matter disease without evidence for acute cortical infarct. 2. Mild attenuation of MCA branch vessels, right greater than left. 3. No significant proximal stenosis, aneurysm, or branch vessel occlusion. 4. Mild atherosclerotic changes within the carotid bifurcations bilaterally without significant stenosis. 5. Degenerative changes in the cervical spine.   Electronically Signed   By: Marin Roberts M.D.   On: 01/04/2015 21:33   Mr Brain Wo Contrast  01/04/2015   CLINICAL DATA:  Acute onset of intermittent aphasia. Episode of right hemiparesis.  EXAM: MRI HEAD WITHOUT CONTRAST  TECHNIQUE: Multiplanar, multiecho pulse sequences of the brain and surrounding structures were obtained without intravenous contrast.  COMPARISON:  CTA head and neck from the same day.  FINDINGS: The diffusion-weighted images demonstrate no evidence for acute or subacute infarction. Mild atrophy and white matter change is slightly advanced for age.  The ventricles are of normal size. A remote left temporal parietal lobe infarct is noted. No significant extraaxial fluid collection is present.  Flow is present in the major intracranial arteries. The globes and orbits are intact. Mild mucosal thickening is present in the anterior ethmoid air cells and frontal sinuses. A sequestered area in the superior left frontal sinus is opacified with osseous changes overlying this area, likely reactive.  The skullbase is within normal limits. Midline structures are  unremarkable.  IMPRESSION: 1. No acute intracranial abnormality. 2. Age advanced atrophy and white matter disease. 3. 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 4. Frontal sinus disease.   Electronically Signed   By: Marin Roberts M.D.   On: 01/04/2015 21:36   I independently viewed and interpreted the above radiology studies and agree with radiologist report.   EKG Interpretation   Date/Time:  Wednesday January 04 2015 18:22:38 EDT Ventricular Rate:  101 PR Interval:  135 QRS Duration: 162 QT Interval:  380 QTC Calculation: 493 R Axis:   153 Text Interpretation:  Sinus tachycardia RBBB and LPFB Nonspecific T wave  abnormality Anterior leads No previous ECGs available Confirmed by Euan Wandler  MD, Reuel Boom ([id number]) on 01/04/2015 6:42:13 PM      MDM   Final diagnoses:  TIA (transient ischemic attack)    54 year old male comes in with intermittent weakness and difficulty. Presented as code stroke with persistent symptoms but resolved shortly after arrival. CT head negative, no acute metabolic abnormalities to explain her symptoms. Patient under a lot of  stress at work, neurology recommending admission for TIA workup. Hospitalist was consulted for admission and will see the patient in the emergency department.     Lyndal Pulley, MD 01/04/15 [phone]

## 2015-01-04 NOTE — ED Notes (Signed)
Patients LSN [id number]

## 2015-01-04 NOTE — ED Notes (Signed)
Patient unable to give any answer to any question other than his name. Unable to state the year, or where he was, pt visibly anxious. Full NIH done, no other neuro deficits. Repaged code stroke.

## 2015-01-04 NOTE — ED Notes (Signed)
Per [facility], pt suddenly lost the use of his R arm and leg, stated it felt heavy, episode lasted for 45 seconds, upon ems arrival, pt had no unilateral weakness but had intermittent periods of aphasia. Pt states he feels pretty normal now. Pt is AAOX4, in NAD. Pt denies pain, denies blurred vision. Hx of MI two years ago and cardiac arresst with a triple bipass.

## 2015-01-04 NOTE — Consult Note (Signed)
Admission H&P    Chief Complaint: Transient aphasia and right-sided weakness.  HPI: Darrell Knapp is an 54 y.o. male with a history of hypertension, hyperlipidemia and coronary artery disease with myocardial infarction, presenting with acute onset of aphasia as well as right hemiparesis. Patient had difficulty formulating what he wanted to say as well as noticeable errors in speech output. Symptoms lasted for a few minutes then resolved. He has no previous history of stroke nor TIA. He has not been on and a platelet therapy. CT scan of his head showed no acute intracranial abnormality. NIH score at the time of this evaluation was 0.  LSN: 4:00 PM on 01/04/2015 tPA Given: No: Deficits resolved mRankin:  Past Medical History  Diagnosis Date  . Coronary artery disease   . Cardiac arrest   . Heart attack   . Hypertension     Past Surgical History  Procedure Laterality Date  . Bypass graft      Family history:: Positive for stroke involving his mother.  Social History:  reports that he has never smoked. He does not have any smokeless tobacco history on file. He reports that he does not drink alcohol. His drug history is not on file.  Allergies: No Known Allergies  Medications: Patient's preadmission medications were reviewed by me.  ROS: History obtained from the patient  General ROS: negative for - chills, fatigue, fever, night sweats, weight gain or weight loss Psychological ROS: negative for - behavioral disorder, hallucinations, memory difficulties, mood swings or suicidal ideation Ophthalmic ROS: negative for - blurry vision, double vision, eye pain or loss of vision ENT ROS: negative for - epistaxis, nasal discharge, oral lesions, sore throat, tinnitus or vertigo Allergy and Immunology ROS: negative for - hives or itchy/watery eyes Hematological and Lymphatic ROS: negative for - bleeding problems, bruising or swollen lymph nodes Endocrine ROS: negative for - galactorrhea,  hair pattern changes, polydipsia/polyuria or temperature intolerance Respiratory ROS: negative for - cough, hemoptysis, shortness of breath or wheezing Cardiovascular ROS: negative for - chest pain, dyspnea on exertion, edema or irregular heartbeat Gastrointestinal ROS: negative for - abdominal pain, diarrhea, hematemesis, nausea/vomiting or stool incontinence Genito-Urinary ROS: negative for - dysuria, hematuria, incontinence or urinary frequency/urgency Musculoskeletal ROS: negative for - joint swelling or muscular weakness Neurological ROS: as noted in HPI Dermatological ROS: negative for rash and skin lesion changes  Physical Examination: Blood pressure 164/97, pulse 95, temperature 98.5 F (36.9 C), temperature source Oral, resp. rate 16, weight 117.8 kg (259 lb 11.2 oz), SpO2 100 %.  HEENT-  Normocephalic, no lesions, without obvious abnormality.  Normal external eye and conjunctiva.  Normal TM's bilaterally.  Normal auditory canals and external ears. Normal external nose, mucus membranes and septum.  Normal pharynx. Neck supple with no masses, nodes, nodules or enlargement. Cardiovascular - regular rate and rhythm, S1, S2 normal, no murmur, click, rub or gallop Lungs - chest clear, no wheezing, rales, normal symmetric air entry Abdomen - soft, non-tender; bowel sounds normal; no masses,  no organomegaly Extremities - no joint deformities, effusion, or inflammation, no edema and no skin discoloration  Neurologic Examination: Mental Status: Alert, oriented, thought content appropriate.  Speech fluent without evidence of aphasia. Able to follow commands without difficulty. Cranial Nerves: II-Visual fields were normal. III/IV/VI-Pupils were equal and reacted normally to light. Extraocular movements were full and conjugate.    V/VII-no facial numbness and no facial weakness. VIII-normal. X-normal speech and symmetrical palatal movement. XI: trapezius strength/neck flexion strength  normal bilaterally XII-midline  tongue extension with normal strength. Motor: 5/5 bilaterally with normal tone and bulk Sensory: Normal throughout. Deep Tendon Reflexes: 1+ and symmetric. Plantars: Mute bilaterally Cerebellar: Normal finger-to-nose testing. Carotid auscultation: Normal  Results for orders placed or performed during the hospital encounter of 01/04/15 (from the past 48 hour(s))  I-Stat Chem 8, ED  (not at [facility], [facility])     Status: Abnormal   Collection Time: 01/04/15  5:52 PM  Result Value Ref Range   Sodium 137 135 - 145 mmol/L   Potassium 4.4 3.5 - 5.1 mmol/L   Chloride 99 (L) 101 - 111 mmol/L   BUN 35 (H) 6 - 20 mg/dL   Creatinine, Ser 1.61 (H) 0.61 - 1.24 mg/dL   Glucose, Bld 096 (H) 65 - 99 mg/dL   Calcium, Ion 0.45 (L) 1.12 - 1.23 mmol/L   TCO2 26 0 - 100 mmol/L   Hemoglobin 19.0 (H) 13.0 - 17.0 g/dL   HCT 40.9 (H) 81.1 - 91.4 %  I-Stat CG4 Lactic Acid, ED     Status: None   Collection Time: 01/04/15  5:53 PM  Result Value Ref Range   Lactic Acid, Venous 1.82 0.5 - 2.0 mmol/L  I-stat troponin, ED (not at [facility], [facility])     Status: None   Collection Time: 01/04/15  6:04 PM  Result Value Ref Range   Troponin i, poc 0.00 0.00 - 0.08 ng/mL   Comment 3            Comment: Due to the release kinetics of cTnI, a negative result within the first hours of the onset of symptoms does not rule out myocardial infarction with certainty. If myocardial infarction is still suspected, repeat the test at appropriate intervals.    No results found.  Assessment: 54 y.o. male with multiple risk factors for stroke presenting with probable MCA territory TIA. Small ischemic stroke cannot be ruled out at this point, however.  Stroke Risk Factors - family history, hyperlipidemia and hypertension  Plan: 1. HgbA1c, fasting lipid panel 2. MRI, MRA  of the brain without contrast 3. PT consult, OT consult, Speech consult 4. Echocardiogram 5. Carotid dopplers 6. Prophylactic  therapy-Antiplatelet med: Aspirin  7. Risk factor modification 8. Telemetry monitoring  C.R. Roseanne Reno, MD  [facility] [phone]  01/04/2015, 6:18 PM

## 2015-01-04 NOTE — Code Documentation (Signed)
54 year old male presents to [facility] via [facility] as code stroke.  Patient is from [place] - traveled here for work - was at his desk and had sudden onset of loss of speech - difficult to find his words and right side weakness.  He stated it lasted about 2 minutes.  On arrival to ED he had returned to baseline.  CT scan done.  NIHSS 0.  Hx of CABG - recently started an antihypertensive medication.  He is alert - well versed - speech is at baseline - he states at one point he could not even remember his wife's name.  Currently can even speak his second language of [place].  Clock will be reset - handoff to [employer] - to repage code stroke if sx return.

## 2015-01-04 NOTE — ED Notes (Signed)
Per Dr. Roseanne Reno, reset the clock, TIA alert, and q2h neuro checks. Pt instructed to hit call button if any new neuro symptoms happen,.

## 2015-01-04 NOTE — Progress Notes (Signed)
Patient arrived to 8N05. AAOx4, vitals taken, questions answered. Staff will continue to monitor. Lamonte Richer, RN

## 2015-01-04 NOTE — Progress Notes (Signed)
Called regarding recurrence of symptoms. Patient again had brief episode of aphasia with perseveration. This time without right sided weakness. This again lasted for a only a few minutes and he has currently returned to baseline with no signs of aphasia or right sided weakness. Exam has returned to normal with no ataxia, no right sided weakness or numbness and no aphasia. NIH is currently 0.   At this time, I feel that this most likley represents stuttering TIA. With return to baseline, again the clock is reset and were he to become perisistently symptomatic, could consider IV tPA.   At this time, I recommend load with aspirin and plavix and will get vascular imaging.   1) Plavix  + ASA  followed by daily asa + plavix  2) CTA head and neck.   Ritta Slot, MD [facility] [phone]  If 7pm- 7am, please page neurology on call as listed in AMION.

## 2015-01-04 NOTE — Progress Notes (Addendum)
Code stroke called for recurrence of stroke symptoms. Pt had a brief episode of aphasia and confusion, no right side weakness. Per Tr[facility] Ore ED RN episode lasted at most for a few minutes. Pt last seen normal per RN at 1900 with hourly rounding, found at 1945 with new symptoms. Neurologist at bedside and NIHSS completed yielding 0. Pt currently back to baselline. CTA ordered and completed STAT. Pt also received aspirin and plavix STAT prior to CTA. Per Neurologist to reset clock, and page out new Code Stroke if symptoms return. Pt also completing MRI while in ED prior to admit to floor.

## 2015-01-05 ENCOUNTER — Observation Stay (HOSPITAL_COMMUNITY): Payer: Managed Care, Other (non HMO)

## 2015-01-05 ENCOUNTER — Ambulatory Visit (HOSPITAL_COMMUNITY): Payer: Managed Care, Other (non HMO)

## 2015-01-05 DIAGNOSIS — J321 Chronic frontal sinusitis: Secondary | ICD-10-CM | POA: Diagnosis not present

## 2015-01-05 DIAGNOSIS — G459 Transient cerebral ischemic attack, unspecified: Secondary | ICD-10-CM | POA: Diagnosis not present

## 2015-01-05 DIAGNOSIS — J329 Chronic sinusitis, unspecified: Secondary | ICD-10-CM | POA: Diagnosis present

## 2015-01-05 DIAGNOSIS — J328 Other chronic sinusitis: Secondary | ICD-10-CM | POA: Diagnosis not present

## 2015-01-05 DIAGNOSIS — G458 Other transient cerebral ischemic attacks and related syndromes: Secondary | ICD-10-CM | POA: Diagnosis not present

## 2015-01-05 DIAGNOSIS — E785 Hyperlipidemia, unspecified: Secondary | ICD-10-CM | POA: Diagnosis not present

## 2015-01-05 DIAGNOSIS — R4701 Aphasia: Secondary | ICD-10-CM

## 2015-01-05 LAB — COMPREHENSIVE METABOLIC PANEL
ALT: 43 U/L (ref 17–63)
AST: 35 U/L (ref 15–41)
Albumin: 3.4 g/dL — ABNORMAL LOW (ref 3.5–5.0)
Alkaline Phosphatase: 119 U/L (ref 38–126)
Anion gap: 11 (ref 5–15)
BUN: 21 mg/dL — ABNORMAL HIGH (ref 6–20)
CALCIUM: 9.1 mg/dL (ref 8.9–10.3)
CO2: 25 mmol/L (ref 22–32)
Chloride: 100 mmol/L — ABNORMAL LOW (ref 101–111)
Creatinine, Ser: 1.29 mg/dL — ABNORMAL HIGH (ref 0.61–1.24)
GFR calc Af Amer: >60 mL/min (ref >60)
GFR calc non Af Amer: >60 mL/min (ref >60)
Glucose, Bld: 117 mg/dL — ABNORMAL HIGH (ref 65–99)
Potassium: 4.3 mmol/L (ref 3.5–5.1)
SODIUM: 136 mmol/L (ref 135–145)
TOTAL PROTEIN: 7.1 g/dL (ref 6.5–8.1)
Total Bilirubin: 1.2 mg/dL (ref 0.3–1.2)

## 2015-01-05 LAB — LIPID PANEL
CHOL/HDL RATIO: 8.9 ratio
Cholesterol: 214 mg/dL — ABNORMAL HIGH (ref 0–200)
HDL: 24 mg/dL — ABNORMAL LOW (ref >40)
LDL Cholesterol: 164 mg/dL — ABNORMAL HIGH (ref 0–99)
TRIGLYCERIDES: 129 mg/dL (ref <150)
VLDL: 26 mg/dL (ref 0–40)

## 2015-01-05 LAB — RAPID URINE DRUG SCREEN, HOSP PERFORMED
Amphetamines: NOT DETECTED
BARBITURATES: NOT DETECTED
Benzodiazepines: NOT DETECTED
Cocaine: NOT DETECTED
Opiates: NOT DETECTED
TETRAHYDROCANNABINOL: NOT DETECTED

## 2015-01-05 MED ORDER — FLUTICASONE PROPIONATE 50 MCG/ACT NA SUSP
2.0000 | Freq: Every day | NASAL | Status: DC
  Administered 2015-01-05 – 2015-01-10 (×5): 2 via NASAL
  Filled 2015-01-05: qty 16

## 2015-01-05 MED ORDER — ATORVASTATIN CALCIUM 80 MG PO TABS
80.0000 mg | ORAL_TABLET | Freq: Every day | ORAL | Status: DC
  Administered 2015-01-05 – 2015-01-10 (×6): 80 mg via ORAL
  Filled 2015-01-05 (×6): qty 1

## 2015-01-05 NOTE — Progress Notes (Signed)
STROKE TEAM PROGRESS NOTE   HISTORY Darrell Knapp is a 54 y.o. male with a history of hypertension, hyperlipidemia and coronary artery disease with myocardial infarction, presenting with acute onset of aphasia as well as right hemiparesis. Patient had difficulty formulating what he wanted to say as well as noticeable errors in speech output. Symptoms lasted for a few minutes then resolved. He has no previous history of stroke nor TIA. He has not been on and a platelet therapy. CT scan of his head showed no acute intracranial abnormality. NIH score at the time of this evaluation was 0.  LSN: 4:00 PM on 01/04/2015 tPA Given: No: Deficits resolved mRankin:   SUBJECTIVE (INTERVAL HISTORY) No family members present. The patient feels he is back to baseline. He is from [facility] and was in [place] on business when he became symptomatic. The patient has a history of coronary artery bypass graft surgery and apparently had atrial fibrillation postoperatively. He was seen by his cardiologist 3 weeks ago for congestive heart failure.   OBJECTIVE Temp:  [97.9 F (36.6 C)-98.6 F (37 C)] 98.2 F (36.8 C) (08/11 0600) Pulse Rate:  [72-101] 73 (08/11 0600) Cardiac Rhythm:  [-] Normal sinus rhythm (08/10 2322) Resp:  [15-25] 16 (08/11 0600) BP: (121-179)/(73-150) 127/98 mmHg (08/11 0600) SpO2:  [93 %-100 %] 98 % (08/11 0600) Weight:  [114.896 kg (253 lb 4.8 oz)-117.8 kg (259 lb 11.2 oz)] 114.896 kg (253 lb 4.8 oz) (08/10 2300)   Recent Labs Lab 01/04/15 1831  GLUCAP 119*    Recent Labs Lab 01/04/15 1752 01/04/15 1800  NA 137 136  K 4.4 4.2  CL 99* 98*  CO2  --  26  GLUCOSE 121* 121*  BUN 35* 24*  CREATININE 1.40* 1.49*  CALCIUM  --  9.5    Recent Labs Lab 01/04/15 1800  AST 35  ALT 51  ALKPHOS 131*  BILITOT 1.2  PROT 7.4  ALBUMIN 3.8    Recent Labs Lab 01/04/15 1752 01/04/15 1800  WBC  --  7.0  NEUTROABS  --  4.4  HGB 19.0* 17.5*  HCT 56.0* 50.8  MCV   --  88.5  PLT  --  308   No results for input(s): CKTOTAL, CKMB, CKMBINDEX, TROPONINI in the last 168 hours.  Recent Labs  01/04/15 1800  LABPROT 14.6  INR 1.12   No results for input(s): COLORURINE, LABSPEC, PHURINE, GLUCOSEU, HGBUR, BILIRUBINUR, KETONESUR, PROTEINUR, UROBILINOGEN, NITRITE, LEUKOCYTESUR in the last 72 hours.  Invalid input(s): APPERANCEUR  No results found for: CHOL, TRIG, HDL, CHOLHDL, VLDL, LDLCALC No results found for: HGBA1C No results found for: LABOPIA, COCAINSCRNUR, LABBENZ, AMPHETMU, THCU, LABBARB  No results for input(s): ETH in the last 168 hours.   IMAGING  Ct Angio Head and Neck W/cm &/or Wo Cm 01/04/2015    1. White matter disease without evidence for acute cortical infarct.  2. Mild attenuation of MCA branch vessels, right greater than left.  3. No significant proximal stenosis, aneurysm, or branch vessel occlusion.  4. Mild atherosclerotic changes within the carotid bifurcations bilaterally without significant stenosis.  5. Degenerative changes in the cervical spine.      Ct Head Wo Contrast 01/04/2015    1. Normal CT appearance the brain.  2. Opacification of the superior aspect of the left frontal sinus with surrounding reactive bone change. This appears to be a chronic process. Recommend correlation with the patient's symptoms.       Mr Brain Wo Contrast 01/04/2015  1. No acute intracranial abnormality.  2. Age advanced atrophy and white matter disease.  3. 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  4. Frontal sinus disease.       PHYSICAL EXAM Pleasant middle aged mildly obese Caucasian male not in distress. . Afebrile. Head is nontraumatic. Neck is supple without bruit.    Cardiac exam no murmur or gallop. Lungs are clear to auscultation. Distal pulses are well felt.  Neurological Exam :  Awake alert oriented. No aphasia, dysarthria or apraxia. Follows commands well. Intact attention, registration and  recall. Pupils equal reactive. Fundi were not visualized. Vision acuity and fields seem adequate. Face is symmetric without weakness. Tongue is midline. Good cough and gag. Motor system exam revealed no upper or lower extremity drift. Symmetric and equal strength in all 4 extremities. Deep tendon reflexes are 1+ symmetric. Plantars are downgoing. Sensation is intact. Gait was not tested. Deep tendon reflexes are 1+ symmetric. Plantars are downgoing. Sensation appears intact bilaterally. Gait was not tested.   ASSESSMENT/PLAN [name] is a 54 y.o. male with history of hypertension, previous CABG with postop atrial fibrillation, CHF, hyperlipidemia, and previous MI,  presenting with transient episodes of speech difficulties, confusion, and bilateral upper extremity weakness.  He did not receive IV t-PA due to resolution of deficits.  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  Resultant  resolution of deficits  MRI  no acute intracranial abnormality. Remote left temporoparietal infarct.  MRA  not performed  CTA of head and neck - no high-grade stenosis.  Carotid Doppler - see CTA of neck.  2D Echo - pending  LDL - 164  HgbA1c pending  Telemetry - NSR so far.  Admission ECG - ST rate 101  Lovenox for VTE prophylaxis  Diet Heart Room service appropriate?: Yes; Fluid consistency:: Thin  no antithrombotic prior to admission, now on clopidogrel 75 mg orally every day  Patient counseled to be compliant with his antithrombotic medications  Ongoing aggressive stroke risk factor management  Therapy recommendations: No follow-up physical therapy  Disposition: Pending  Hypertension  Home meds: Lisinopril and hydrochlorothiazide  Stable  Patient counseled to be compliant with his blood pressure medications  Hyperlipidemia  Home meds:  No lipid lowering medications prior to admission  LDL 164, goal < 70  Add  Lipitor 80 mg daily  Continue statin at discharge  Diabetes  HgbA1c pending, goal < 7.0  No history of diabetes mellitus  Other Stroke Risk Factors  Obesity, Body mass index is 36.34 kg/(m^2).   Hx of stroke by MRI  Family hx stroke (mother)  Coronary artery disease   Other Active Problems  Mild renal insufficiency (recently placed on ACE inhibitor and hydrochlorothiazide)  Other Pertinent History   PLAN  Continue stroke workup  Await EEG  Possible TEE and loop when patient returns to [facility] if workup here is unrevealing.   Hospital day #   Delton See PA-C [facility] Pager [phone] 01/05/2015, 12:04 PM I have personally examined this patient, reviewed notes, independently viewed imaging studies, participated in medical decision making and plan of care. I have made any additions or clarifications directly to the above note. Agree with note above. He  has presented with. Recurrent transient symptoms of expressive language difficulties as well as some altered consciousness and memory loss possibly left brain TIA is as well as complex partial seizures. 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 Recommend check EEG and if focal abnormalities are noted may need seizure medications. He will also need prolonged cardiac monitoring for processing atrial fibrillation but since the patient lives out of the area this could be done in [place] where he lives. Continue aspirin for now.  Delia Heady, MD Medical Director [facility] Pager: [phone] 01/05/2015 12:16 PM     To contact Stroke Continuity provider, please refer to [email or web address]. After hours, contact General Neurology

## 2015-01-05 NOTE — Progress Notes (Signed)
[facility] PROGRESS NOTE  Darrell Knapp WUJ:[id number] DOB: [birth year] DOA: 01/04/2015 PCP: No primary care provider on file.  Assessment/Plan:  Principal Problem:   TIA (transient ischemic attack)  LDL 164. Started statin. On ASA.  Echo pending.  EEG negative.  hgb a1c pending. F/u with cardiologist in [place] Active Problems:   Hypertension   CAD (coronary artery disease)   Renal insufficiency: baseline unknown   Aphasia   Sinusitis, chronic: add flonase.   HPI/Subjective: No recurrence of symptoms. Does report post nasal drip, chronic frontal sinus pressure and swelling.  Objective: Filed Vitals:   01/05/15 1200  BP: 115/57  Pulse: 82  Temp: 98.1 F (36.7 C)  Resp: 15   No intake or output data in the 24 hours ending 01/05/15 1656 Filed Weights   01/04/15 1800 01/04/15 2300  Weight: 117.8 kg (259 lb 11.2 oz) 114.896 kg (253 lb 4.8 oz)    Exam:   General:  A and o  HEENT: left frontal sinus tender  Cardiovascular: RRR without MGR  Respiratory: CTA without WRR  Abdomen: S, NT, ND  Ext: no CCE  Neuro: speech clear and fluent. Sensorimotor intact.  Basic Metabolic Panel:  Recent Labs Lab 01/04/15 1752 01/04/15 1800 01/05/15 0645  NA 137 136 136  K 4.4 4.2 4.3  CL 99* 98* 100*  CO2  --  26 25  GLUCOSE 121* 121* 117*  BUN 35* 24* 21*  CREATININE 1.40* 1.49* 1.29*  CALCIUM  --  9.5 9.1   Liver Function Tests:  Recent Labs Lab 01/04/15 1800 01/05/15 0645  AST 35 35  ALT 51 43  ALKPHOS 131* 119  BILITOT 1.2 1.2  PROT 7.4 7.1  ALBUMIN 3.8 3.4*   No results for input(s): LIPASE, AMYLASE in the last 168 hours. No results for input(s): AMMONIA in the last 168 hours. CBC:  Recent Labs Lab 01/04/15 1752 01/04/15 1800  WBC  --  7.0  NEUTROABS  --  4.4  HGB 19.0* 17.5*  HCT 56.0* 50.8  MCV  --  88.5  PLT  --  308   Cardiac Enzymes: No results for input(s): CKTOTAL, CKMB, CKMBINDEX, TROPONINI in the last 168 hours. BNP (last 3  results) No results for input(s): BNP in the last 8760 hours.  ProBNP (last 3 results) No results for input(s): PROBNP in the last 8760 hours.  CBG:  Recent Labs Lab 01/04/15 1831  GLUCAP 119*    No results found for this or any previous visit (from the past 240 hour(s)).   Studies: Ct Angio Head W/cm &/or Wo Cm  01/04/2015   CLINICAL DATA:  Episode lasting 45 seconds with acute onset weakness in the right upper and lower extremities. Persistent intermittent aphasia  EXAM: CT ANGIOGRAPHY HEAD AND NECK  TECHNIQUE: Multidetector CT imaging of the head and neck was performed using the standard protocol during bolus administration of intravenous contrast. Multiplanar CT image reconstructions and MIPs were obtained to evaluate the vascular anatomy. Carotid stenosis measurements (when applicable) are obtained utilizing NASCET criteria, using the distal internal carotid diameter as the denominator.  CONTRAST:  OMNIPAQUE IOHEXOL 350 MG/ML SOLN  COMPARISON:  CT head without contrast from the same day.  FINDINGS: CT HEAD  Brain: The source images demonstrate no evidence for acute or subacute infarction. Basal ganglia are intact. The insular ribbon is intact. No acute cortical infarct is present.  Calvarium and skull base: Within normal limits.  Paranasal sinuses: Mild mucosal thickening is present in the frontal  sinuses bilaterally. The paranasal sinuses and mastoid air cells are otherwise clear.  Orbits: Negative  CTA NECK  Aortic arch: A common origin of the left common carotid artery and innominate artery is noted. The study is mildly degraded by patient motion.  Right carotid system: The right common carotid artery is within normal limits. Atherosclerotic calcifications are present at the origin of the right internal carotid artery without significant stenosis. The cervical right ICA is normal.  Left carotid system: The left common carotid artery is within normal limits. Atherosclerotic changes are  present at the carotid bifurcation without a significant stenosis relative to the more distal vessel. The more distal cervical ICA is normal.  Vertebral arteries:The vertebral arteries originate from of the subclavian arteries bilaterally. Mild to moderate narrowing is present at proximal left vertebral artery. No focal stenosis or vascular injury is evident within the vertebral arteries.  Skeleton: Mild endplate degenerative changes are present from C3-4 through C5-6. Uncovertebral spurring is worse left than right at C3-4 and C5-6. No focal lytic or blastic lesions are present.  Other neck: The soft tissues of the neck demonstrate calcifications in the palatine tonsils bilaterally compatible with the history of infection. Lymph nodes are present in the parotid glands bilaterally. Reactive size level 2 and level 3 lymph nodes are present bilaterally. No significant cervical adenopathy is present.  The lung apices are clear  CTA HEAD  Anterior circulation: Minimal atherosclerotic changes are present within the cavernous internal carotid arteries bilaterally without significant stenosis through the ICA termini. The A1 and M1 segments are normal. The MCA bifurcations are intact. There is some attenuation of distal MCA branch vessels, worse on the right.  Posterior circulation: The vertebral arteries are codominant. PICA origins are visualized and normal bilaterally. A fetal type left posterior cerebral artery is present. The right posterior cerebral artery originates from the basilar tip.  Venous sinuses: The dural sinuses are patent. The straight sinus and deep cerebral veins are intact. Cortical veins are within normal limits.  Anatomic variants: Fetal type left posterior cerebral artery.  Delayed phase: The postcontrast images demonstrate no pathologic enhancement.  IMPRESSION: 1. White matter disease without evidence for acute cortical infarct. 2. Mild attenuation of MCA branch vessels, right greater than left. 3.  No significant proximal stenosis, aneurysm, or branch vessel occlusion. 4. Mild atherosclerotic changes within the carotid bifurcations bilaterally without significant stenosis. 5. Degenerative changes in the cervical spine.   Electronically Signed   By: Marin Roberts M.D.   On: 01/04/2015 21:33   Dg Chest 2 View  01/05/2015   CLINICAL DATA:  Recent TIAs  EXAM: CHEST - 2 VIEW  COMPARISON:  None.  FINDINGS: Cardiac shadow is within normal limits. Postsurgical changes are seen. The lungs are well aerated bilaterally. No bony abnormality is seen.  IMPRESSION: No active disease.   Electronically Signed   By: Alcide Clever M.D.   On: 01/05/2015 09:15   Ct Head Wo Contrast  01/04/2015   CLINICAL DATA:  Right-sided weakness and aphasia.  Code stroke.  EXAM: CT HEAD WITHOUT CONTRAST  TECHNIQUE: Contiguous axial images were obtained from the base of the skull through the vertex without intravenous contrast.  COMPARISON:  None.  FINDINGS: No acute cortical infarct, hemorrhage, or mass lesion is present. The ventricles are of normal size. No significant extra-axial fluid collection is evident. Focal opacity in the superior left frontal lobe appears separated by septation. There is surrounding reactive bone change. Paranasal sinuses are otherwise clear. The  mastoid air cells are clear.  ASPECTS score = 10/10  [place] Stroke Program Early CT Score  Normal score = 10  IMPRESSION: 1. Normal CT appearance the brain. 2. Opacification of the superior aspect of the left frontal sinus with surrounding reactive bone change. This appears to be a chronic process. Recommend correlation with the patient's symptoms.  These results were called by telephone at the time of interpretation on 01/04/2015 at 6:17 pm to Dr. Noel Christmas , who verbally acknowledged these results.   Electronically Signed   By: Marin Roberts M.D.   On: 01/04/2015 18:29   Ct Angio Neck W/cm &/or Wo/cm  01/04/2015   CLINICAL DATA:  Episode lasting  45 seconds with acute onset weakness in the right upper and lower extremities. Persistent intermittent aphasia  EXAM: CT ANGIOGRAPHY HEAD AND NECK  TECHNIQUE: Multidetector CT imaging of the head and neck was performed using the standard protocol during bolus administration of intravenous contrast. Multiplanar CT image reconstructions and MIPs were obtained to evaluate the vascular anatomy. Carotid stenosis measurements (when applicable) are obtained utilizing NASCET criteria, using the distal internal carotid diameter as the denominator.  CONTRAST:  OMNIPAQUE IOHEXOL 350 MG/ML SOLN  COMPARISON:  CT head without contrast from the same day.  FINDINGS: CT HEAD  Brain: The source images demonstrate no evidence for acute or subacute infarction. Basal ganglia are intact. The insular ribbon is intact. No acute cortical infarct is present.  Calvarium and skull base: Within normal limits.  Paranasal sinuses: Mild mucosal thickening is present in the frontal sinuses bilaterally. The paranasal sinuses and mastoid air cells are otherwise clear.  Orbits: Negative  CTA NECK  Aortic arch: A common origin of the left common carotid artery and innominate artery is noted. The study is mildly degraded by patient motion.  Right carotid system: The right common carotid artery is within normal limits. Atherosclerotic calcifications are present at the origin of the right internal carotid artery without significant stenosis. The cervical right ICA is normal.  Left carotid system: The left common carotid artery is within normal limits. Atherosclerotic changes are present at the carotid bifurcation without a significant stenosis relative to the more distal vessel. The more distal cervical ICA is normal.  Vertebral arteries:The vertebral arteries originate from of the subclavian arteries bilaterally. Mild to moderate narrowing is present at proximal left vertebral artery. No focal stenosis or vascular injury is evident within the  vertebral arteries.  Skeleton: Mild endplate degenerative changes are present from C3-4 through C5-6. Uncovertebral spurring is worse left than right at C3-4 and C5-6. No focal lytic or blastic lesions are present.  Other neck: The soft tissues of the neck demonstrate calcifications in the palatine tonsils bilaterally compatible with the history of infection. Lymph nodes are present in the parotid glands bilaterally. Reactive size level 2 and level 3 lymph nodes are present bilaterally. No significant cervical adenopathy is present.  The lung apices are clear  CTA HEAD  Anterior circulation: Minimal atherosclerotic changes are present within the cavernous internal carotid arteries bilaterally without significant stenosis through the ICA termini. The A1 and M1 segments are normal. The MCA bifurcations are intact. There is some attenuation of distal MCA branch vessels, worse on the right.  Posterior circulation: The vertebral arteries are codominant. PICA origins are visualized and normal bilaterally. A fetal type left posterior cerebral artery is present. The right posterior cerebral artery originates from the basilar tip.  Venous sinuses: The dural sinuses are patent. The straight  sinus and deep cerebral veins are intact. Cortical veins are within normal limits.  Anatomic variants: Fetal type left posterior cerebral artery.  Delayed phase: The postcontrast images demonstrate no pathologic enhancement.  IMPRESSION: 1. White matter disease without evidence for acute cortical infarct. 2. Mild attenuation of MCA branch vessels, right greater than left. 3. No significant proximal stenosis, aneurysm, or branch vessel occlusion. 4. Mild atherosclerotic changes within the carotid bifurcations bilaterally without significant stenosis. 5. Degenerative changes in the cervical spine.   Electronically Signed   By: Marin Roberts M.D.   On: 01/04/2015 21:33   Mr Brain Wo Contrast  01/04/2015   CLINICAL DATA:  Acute onset  of intermittent aphasia. Episode of right hemiparesis.  EXAM: MRI HEAD WITHOUT CONTRAST  TECHNIQUE: Multiplanar, multiecho pulse sequences of the brain and surrounding structures were obtained without intravenous contrast.  COMPARISON:  CTA head and neck from the same day.  FINDINGS: The diffusion-weighted images demonstrate no evidence for acute or subacute infarction. Mild atrophy and white matter change is slightly advanced for age.  The ventricles are of normal size. A remote left temporal parietal lobe infarct is noted. No significant extraaxial fluid collection is present.  Flow is present in the major intracranial arteries. The globes and orbits are intact. Mild mucosal thickening is present in the anterior ethmoid air cells and frontal sinuses. A sequestered area in the superior left frontal sinus is opacified with osseous changes overlying this area, likely reactive.  The skullbase is within normal limits. Midline structures are unremarkable.  IMPRESSION: 1. No acute intracranial abnormality. 2. Age advanced atrophy and white matter disease. 3. 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 4. Frontal sinus disease.   Electronically Signed   By: Marin Roberts M.D.   On: 01/04/2015 21:36    Scheduled Meds: . aspirin EC  325 mg Oral Daily  . atorvastatin  80 mg Oral q1800  . clopidogrel  75 mg Oral Daily  . enoxaparin (LOVENOX) injection  40 mg Subcutaneous Q24H  . fluticasone  2 spray Each Nare Daily  . lisinopril  20 mg Oral Daily   Continuous Infusions: . sodium chloride 75 mL/hr at 01/05/15 0018    Time spent: 25 minutes  Darrell Knapp L  [facility] [email or web address], password [facility] 01/05/2015, 4:56 PM

## 2015-01-05 NOTE — Progress Notes (Signed)
EEG Completed; Results Pending  

## 2015-01-05 NOTE — Evaluation (Signed)
Occupational Therapy Evaluation Patient Details Name: Darrell Knapp MRN: [id number] DOB: Jan 10, 1961 Today's Date: 01/05/2015    History of Present Illness 54 y.o. male with history of CAD status post CABG, cardiac arrest and hypertension was brought to the ER after patient was found to have right-sided weakness with aphasia and confusion. Neuro workup pending   Clinical Impression   Pt was seen for initial evaluation.  Pt independent with adls including retrieving items and no difficulty noted with strength, coordination nor vision.  No further OT is needed at this time.    Follow Up Recommendations  No OT follow up    Equipment Recommendations  None recommended by OT    Recommendations for Other Services       Precautions / Restrictions Precautions Precautions: None Restrictions Weight Bearing Restrictions: No      Mobility Bed Mobility Overal bed mobility: Modified Independent             General bed mobility comments: HOB raised  Transfers Overall transfer level: Independent                    Balance                                            ADL Overall ADL's : Independent                                       General ADL Comments: pt ambulated to closets to look for glasses; retrieved bag. Able to don watch using RUE.  Pt feels back to baseline.  Visual fields grossly intact.  Pt did find glasses lying on floor     Vision     Perception Perception Comments: no difficulties in functional context   Praxis      Pertinent Vitals/Pain Pain Assessment: No/denies pain     Hand Dominance Left   Extremity/Trunk Assessment Upper Extremity Assessment Upper Extremity Assessment: Overall WFL for tasks assessed           Communication Communication Communication: No difficulties   Cognition Arousal/Alertness: Awake/alert Behavior During Therapy: WFL for tasks assessed/performed Overall Cognitive  Status: Within Functional Limits for tasks assessed                     General Comments       Exercises       Shoulder Instructions      Home Living Family/patient expects to be discharged to:: Private residence Living Arrangements: Spouse/significant other Available Help at Discharge: Family               Bathroom Shower/Tub: Tub/shower unit;Walk-in shower   Bathroom Toilet: Standard         Additional Comments: pt lives in [place]--wife coming today      Prior Functioning/Environment Level of Independence: Independent             OT Diagnosis: Hemiplegia non-dominant side (resolved)   OT Problem List:     OT Treatment/Interventions:      OT Goals(Current goals can be found in the care plan section) Acute Rehab OT Goals Patient Stated Goal: to fly home to [place]  OT Frequency:     Barriers to D/C:  Co-evaluation              End of Session    Activity Tolerance: Patient tolerated treatment well Patient left: in bed;with call bell/phone within reach   Time: 1610-9604 OT Time Calculation (min): 10 min Charges:  OT General Charges $OT Visit: 1 Procedure OT Evaluation $Initial OT Evaluation Tier I: 1 Procedure G-Codes: OT G-codes **NOT FOR INPATIENT CLASS** Functional Assessment Tool Used: clinical judgment and observation Functional Limitation: Self care Self Care Current Status (V4098): 0 percent impaired, limited or restricted Self Care Goal Status (J1914): 0 percent impaired, limited or restricted Self Care Discharge Status (N8295): 0 percent impaired, limited or restricted  Denese Mentink 01/05/2015, 3:32 PM Marica Otter, OTR/L [phone] 01/05/2015

## 2015-01-05 NOTE — Evaluation (Signed)
Physical Therapy Evaluation Patient Details Name: Darrell Knapp MRN: [id number] DOB: [birth year] Today's Date: 01/05/2015   History of Present Illness  54 y.o. male with history of CAD status post CABG, cardiac arrest and hypertension was brought to the ER after patient was found to have right-sided weakness with aphasia and confusion. Neuro workup pending  Clinical Impression  Patient seen for mobility evaluation and strength assessment. At this time, all symptoms resolved, patient mobilizing independently with normal DGI score. No further acute PT needs, will sign off.     Follow Up Recommendations No PT follow up    Equipment Recommendations  None recommended by PT    Recommendations for Other Services       Precautions / Restrictions        Mobility  Bed Mobility Overal bed mobility: Independent                Transfers Overall transfer level: Independent                  Ambulation/Gait Ambulation/Gait assistance: Independent Ambulation Distance (Feet): 380 Feet Assistive device: None Gait Pattern/deviations: WFL(Within Functional Limits)   Gait velocity interpretation: at or above normal speed for age/gender    Stairs Stairs: Yes Stairs assistance: Independent Stair Management: Forwards;Alternating pattern;No rails Number of Stairs: 4 General stair comments: no difficulty  Wheelchair Mobility    Modified Rankin (Stroke Patients Only) Modified Rankin (Stroke Patients Only) Pre-Morbid Rankin Score: No symptoms Modified Rankin: No symptoms     Balance Overall balance assessment: Independent                               Standardized Balance Assessment Standardized Balance Assessment : Dynamic Gait Index   Dynamic Gait Index Level Surface: Normal Change in Gait Speed: Normal Gait with Horizontal Head Turns: Normal Gait with Vertical Head Turns: Normal Gait and Pivot Turn: Normal Step Over Obstacle: Normal Step Around  Obstacles: Normal Steps: Normal Total Score: 24       Pertinent Vitals/Pain Pain Assessment: No/denies pain    Home Living Family/patient expects to be discharged to:: Private residence Living Arrangements: Spouse/significant other Available Help at Discharge: Family Type of Home: House Home Access: Stairs to enter Entrance Stairs-Rails: None Secretary/administrator of Steps: 2 Home Layout: Able to live on main level with bedroom/bathroom Home Equipment: Cane - single point      Prior Function Level of Independence: Independent               Hand Dominance   Dominant Hand: Left    Extremity/Trunk Assessment   Upper Extremity Assessment: Overall WFL for tasks assessed           Lower Extremity Assessment: Overall WFL for tasks assessed         Communication   Communication: No difficulties  Cognition Arousal/Alertness: Awake/alert Behavior During Therapy: WFL for tasks assessed/performed Overall Cognitive Status: Within Functional Limits for tasks assessed                      General Comments General comments (skin integrity, edema, etc.): BP assessed prior to mobility 120s/70s    Exercises        Assessment/Plan    PT Assessment Patent does not need any further PT services  PT Diagnosis Altered mental status   PT Problem List    PT Treatment Interventions     PT Goals (Current goals can  be found in the Care Plan section) Acute Rehab PT Goals Patient Stated Goal: to fly home PT Goal Formulation: With patient    Frequency     Barriers to discharge        Co-evaluation               End of Session   Activity Tolerance: Patient tolerated treatment well Patient left:  (with transport)      Functional Assessment Tool Used: Dynamic Gait index Functional Limitation: Mobility: Walking and moving around Mobility: Walking and Moving Around Current Status (Z6109): 0 percent impaired, limited or restricted Mobility: Walking  and Moving Around Goal Status [phone]): 0 percent impaired, limited or restricted Mobility: Walking and Moving Around Discharge Status [phone]): 0 percent impaired, limited or restricted    Time: 0743-0802 PT Time Calculation (min) (ACUTE ONLY): 19 min   Charges:   PT Evaluation $Initial PT Evaluation Tier I: 1 Procedure     PT G Codes:   PT G-Codes **NOT FOR INPATIENT CLASS** Functional Assessment Tool Used: Dynamic Gait index Functional Limitation: Mobility: Walking and moving around Mobility: Walking and Moving Around Current Status (B1478): 0 percent impaired, limited or restricted Mobility: Walking and Moving Around Goal Status (G9562): 0 percent impaired, limited or restricted Mobility: Walking and Moving Around Discharge Status [phone]): 0 percent impaired, limited or restricted    Fabio Asa 01/05/2015, 8:10 AM Charlotte Crumb, PT DPT  [phone]

## 2015-01-05 NOTE — Progress Notes (Deleted)
Documented at wrong time

## 2015-01-05 NOTE — Procedures (Signed)
EEG report.  Brief clinical history: 54 y.o. male with multiple risk factors for stroke presenting with probable MCA territory TIA vs seizure.  Technique: this is a 17 channel routine scalp EEG performed at the bedside with bipolar and monopolar montages arranged in accordance to the international 10/20 system of electrode placement. One channel was dedicated to EKG recording.  The study was performed during wakefulness, drowsiness, and stage 2 sleep. No activating procedures performed.  Description:In the wakeful state, the best background consisted of a low amplitude, posterior dominant, well sustained, symmetric and reactive 11 Hz rhythm. Drowsiness demonstrated dropout of the alpha rhythm. Stage 2 sleep showed symmetric and synchronous sleep spindles without intermixed epileptiform discharges. No focal or generalized epileptiform discharges noted.  No pathologic areas of slowing seen.  EKG showed sinus rhythm.  Impression: this is a normal awake and asleep EEG. Please, be aware that a normal EEG does not exclude the possibility of epilepsy.  Clinical correlation is advised.   Wyatt Portela, MD

## 2015-01-06 ENCOUNTER — Other Ambulatory Visit: Payer: Self-pay

## 2015-01-06 ENCOUNTER — Observation Stay (HOSPITAL_BASED_OUTPATIENT_CLINIC_OR_DEPARTMENT_OTHER): Payer: Managed Care, Other (non HMO)

## 2015-01-06 DIAGNOSIS — G458 Other transient cerebral ischemic attacks and related syndromes: Secondary | ICD-10-CM

## 2015-01-06 DIAGNOSIS — R4701 Aphasia: Secondary | ICD-10-CM | POA: Diagnosis not present

## 2015-01-06 DIAGNOSIS — I255 Ischemic cardiomyopathy: Secondary | ICD-10-CM | POA: Diagnosis not present

## 2015-01-06 DIAGNOSIS — I2510 Atherosclerotic heart disease of native coronary artery without angina pectoris: Secondary | ICD-10-CM

## 2015-01-06 DIAGNOSIS — G459 Transient cerebral ischemic attack, unspecified: Secondary | ICD-10-CM | POA: Diagnosis not present

## 2015-01-06 LAB — HEMOGLOBIN A1C
Hgb A1c MFr Bld: 7 % — ABNORMAL HIGH (ref 4.8–5.6)
MEAN PLASMA GLUCOSE: 154 mg/dL

## 2015-01-06 LAB — BRAIN NATRIURETIC PEPTIDE: B Natriuretic Peptide: 147.9 pg/mL — ABNORMAL HIGH (ref 0.0–100.0)

## 2015-01-06 LAB — MAGNESIUM: Magnesium: 2 mg/dL (ref 1.7–2.4)

## 2015-01-06 LAB — TSH: TSH: 2.236 u[IU]/mL (ref 0.350–4.500)

## 2015-01-06 MED ORDER — CARVEDILOL 3.125 MG PO TABS
3.1250 mg | ORAL_TABLET | Freq: Two times a day (BID) | ORAL | Status: DC
  Administered 2015-01-07 – 2015-01-10 (×8): 3.125 mg via ORAL
  Filled 2015-01-06 (×8): qty 1

## 2015-01-06 MED ORDER — PERFLUTREN LIPID MICROSPHERE
1.0000 mL | INTRAVENOUS | Status: AC | PRN
  Administered 2015-01-06: 2 mL via INTRAVENOUS
  Filled 2015-01-06: qty 10

## 2015-01-06 NOTE — Progress Notes (Signed)
Patient had an aphasic episode after ambulating in the hall this morning cardiac consult ordered and patient will have cardiac catheterization on Monday, will continue to monitor.

## 2015-01-06 NOTE — Progress Notes (Addendum)
[facility] PROGRESS NOTE  Darrell Knapp ZOX:[id number] DOB: [birth year] DOA: 01/04/2015 PCP: No primary care provider on file.  Called by nursing staff that patient had sudden episode of expressive aphasia while walking the unit.  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 Episode almost resolved currently. Discussed with Dr. Pearlean Brownie. He recommends a cardiology consult given cardiomyopathy. I reviewed telemetry alarms. Episodes of tachycardia which look like they may be atrial fibrillation/flutter, hard to say. Might need anticoagulation. 12-lead EKG shows sinus rhythm.  LDL 164. Started statin. On ASA.  Echo shows ejection fraction of 20% but no source of embolus.  EEG negative.  hgb a1c 7. patient has no cardiologist currently. Was scheduled to see a cardiothoracic surgeon in [place]. Will ask for records from Dr. Graciella Belton, CT surgery. Telephone number [phone]. has been hospitalized in [place], [place] [facility].  Active Problems:  ischemic cardiomyopathy: Unclear whether this is an old or new problem. Have asked for records. Have consulted cardiology per neurology recommendations. Already on ACE inhibitor. Will defer further medications to cardiology.   Hypertension   CAD (coronary artery disease)   Renal insufficiency: baseline unknown   Aphasia   Sinusitis, chronic: add flonase.   HPI/Subjective: Had sudden episode of expressive aphasia lasting about 10-15 minutes. Nearly resolved currently.   Objective: Filed Vitals:   01/06/15 1040  BP: 137/96  Pulse: 99  Temp: 97.5 F (36.4 C)  Resp: 24   No intake or output data in the 24 hours ending 01/06/15 1115 Filed Weights   01/04/15 1800 01/04/15 2300  Weight: 117.8 kg (259 lb 11.2 oz) 114.896 kg (253 lb 4.8 oz)    Exam:   General:  Alert, anxious appearing. Cooperative   HEENT: left frontal sinus tender  Cardiovascular: RRR  without MGR  Respiratory: CTA without WRR  Abdomen: S, NT, ND  Ext: no CCE  Neuro: speech clear with very rare word finding difficulties. Sensory motor exam nonfocal.   Basic Metabolic Panel:  Recent Labs Lab 01/04/15 1752 01/04/15 1800 01/05/15 0645  NA 137 136 136  K 4.4 4.2 4.3  CL 99* 98* 100*  CO2  --  26 25  GLUCOSE 121* 121* 117*  BUN 35* 24* 21*  CREATININE 1.40* 1.49* 1.29*  CALCIUM  --  9.5 9.1   Liver Function Tests:  Recent Labs Lab 01/04/15 1800 01/05/15 0645  AST 35 35  ALT 51 43  ALKPHOS 131* 119  BILITOT 1.2 1.2  PROT 7.4 7.1  ALBUMIN 3.8 3.4*   No results for input(s): LIPASE, AMYLASE in the last 168 hours. No results for input(s): AMMONIA in the last 168 hours. CBC:  Recent Labs Lab 01/04/15 1752 01/04/15 1800  WBC  --  7.0  NEUTROABS  --  4.4  HGB 19.0* 17.5*  HCT 56.0* 50.8  MCV  --  88.5  PLT  --  308   Cardiac Enzymes: No results for input(s): CKTOTAL, CKMB, CKMBINDEX, TROPONINI in the last 168 hours. BNP (last 3 results) No results for input(s): BNP in the last 8760 hours.  ProBNP (last 3 results) No results for input(s): PROBNP in the last 8760 hours.  CBG:  Recent Labs Lab 01/04/15 1831  GLUCAP 119*    No results found for this or any previous visit (from the past 240 hour(s)).   Studies: Ct Angio Head W/cm &/or Wo Cm  01/04/2015   CLINICAL DATA:  Episode lasting 45 seconds with  acute onset weakness in the right upper and lower extremities. Persistent intermittent aphasia  EXAM: CT ANGIOGRAPHY HEAD AND NECK  TECHNIQUE: Multidetector CT imaging of the head and neck was performed using the standard protocol during bolus administration of intravenous contrast. Multiplanar CT image reconstructions and MIPs were obtained to evaluate the vascular anatomy. Carotid stenosis measurements (when applicable) are obtained utilizing NASCET criteria, using the distal internal carotid diameter as the denominator.  CONTRAST:   OMNIPAQUE IOHEXOL 350 MG/ML SOLN  COMPARISON:  CT head without contrast from the same day.  FINDINGS: CT HEAD  Brain: The source images demonstrate no evidence for acute or subacute infarction. Basal ganglia are intact. The insular ribbon is intact. No acute cortical infarct is present.  Calvarium and skull base: Within normal limits.  Paranasal sinuses: Mild mucosal thickening is present in the frontal sinuses bilaterally. The paranasal sinuses and mastoid air cells are otherwise clear.  Orbits: Negative  CTA NECK  Aortic arch: A common origin of the left common carotid artery and innominate artery is noted. The study is mildly degraded by patient motion.  Right carotid system: The right common carotid artery is within normal limits. Atherosclerotic calcifications are present at the origin of the right internal carotid artery without significant stenosis. The cervical right ICA is normal.  Left carotid system: The left common carotid artery is within normal limits. Atherosclerotic changes are present at the carotid bifurcation without a significant stenosis relative to the more distal vessel. The more distal cervical ICA is normal.  Vertebral arteries:The vertebral arteries originate from of the subclavian arteries bilaterally. Mild to moderate narrowing is present at proximal left vertebral artery. No focal stenosis or vascular injury is evident within the vertebral arteries.  Skeleton: Mild endplate degenerative changes are present from C3-4 through C5-6. Uncovertebral spurring is worse left than right at C3-4 and C5-6. No focal lytic or blastic lesions are present.  Other neck: The soft tissues of the neck demonstrate calcifications in the palatine tonsils bilaterally compatible with the history of infection. Lymph nodes are present in the parotid glands bilaterally. Reactive size level 2 and level 3 lymph nodes are present bilaterally. No significant cervical adenopathy is present.  The lung apices are clear   CTA HEAD  Anterior circulation: Minimal atherosclerotic changes are present within the cavernous internal carotid arteries bilaterally without significant stenosis through the ICA termini. The A1 and M1 segments are normal. The MCA bifurcations are intact. There is some attenuation of distal MCA branch vessels, worse on the right.  Posterior circulation: The vertebral arteries are codominant. PICA origins are visualized and normal bilaterally. A fetal type left posterior cerebral artery is present. The right posterior cerebral artery originates from the basilar tip.  Venous sinuses: The dural sinuses are patent. The straight sinus and deep cerebral veins are intact. Cortical veins are within normal limits.  Anatomic variants: Fetal type left posterior cerebral artery.  Delayed phase: The postcontrast images demonstrate no pathologic enhancement.  IMPRESSION: 1. White matter disease without evidence for acute cortical infarct. 2. Mild attenuation of MCA branch vessels, right greater than left. 3. No significant proximal stenosis, aneurysm, or branch vessel occlusion. 4. Mild atherosclerotic changes within the carotid bifurcations bilaterally without significant stenosis. 5. Degenerative changes in the cervical spine.   Electronically Signed   By: Marin Roberts M.D.   On: 01/04/2015 21:33   Dg Chest 2 View  01/05/2015   CLINICAL DATA:  Recent TIAs  EXAM: CHEST - 2 VIEW  COMPARISON:  None.  FINDINGS: Cardiac shadow is within normal limits. Postsurgical changes are seen. The lungs are well aerated bilaterally. No bony abnormality is seen.  IMPRESSION: No active disease.   Electronically Signed   By: Alcide Clever M.D.   On: 01/05/2015 09:15   Ct Head Wo Contrast  01/04/2015   CLINICAL DATA:  Right-sided weakness and aphasia.  Code stroke.  EXAM: CT HEAD WITHOUT CONTRAST  TECHNIQUE: Contiguous axial images were obtained from the base of the skull through the vertex without intravenous contrast.  COMPARISON:   None.  FINDINGS: No acute cortical infarct, hemorrhage, or mass lesion is present. The ventricles are of normal size. No significant extra-axial fluid collection is evident. Focal opacity in the superior left frontal lobe appears separated by septation. There is surrounding reactive bone change. Paranasal sinuses are otherwise clear. The mastoid air cells are clear.  ASPECTS score = 10/10  [place] Stroke Program Early CT Score  Normal score = 10  IMPRESSION: 1. Normal CT appearance the brain. 2. Opacification of the superior aspect of the left frontal sinus with surrounding reactive bone change. This appears to be a chronic process. Recommend correlation with the patient's symptoms.  These results were called by telephone at the time of interpretation on 01/04/2015 at 6:17 pm to Dr. Noel Christmas , who verbally acknowledged these results.   Electronically Signed   By: Marin Roberts M.D.   On: 01/04/2015 18:29   Ct Angio Neck W/cm &/or Wo/cm  01/04/2015   CLINICAL DATA:  Episode lasting 45 seconds with acute onset weakness in the right upper and lower extremities. Persistent intermittent aphasia  EXAM: CT ANGIOGRAPHY HEAD AND NECK  TECHNIQUE: Multidetector CT imaging of the head and neck was performed using the standard protocol during bolus administration of intravenous contrast. Multiplanar CT image reconstructions and MIPs were obtained to evaluate the vascular anatomy. Carotid stenosis measurements (when applicable) are obtained utilizing NASCET criteria, using the distal internal carotid diameter as the denominator.  CONTRAST:  OMNIPAQUE IOHEXOL 350 MG/ML SOLN  COMPARISON:  CT head without contrast from the same day.  FINDINGS: CT HEAD  Brain: The source images demonstrate no evidence for acute or subacute infarction. Basal ganglia are intact. The insular ribbon is intact. No acute cortical infarct is present.  Calvarium and skull base: Within normal limits.  Paranasal sinuses: Mild mucosal  thickening is present in the frontal sinuses bilaterally. The paranasal sinuses and mastoid air cells are otherwise clear.  Orbits: Negative  CTA NECK  Aortic arch: A common origin of the left common carotid artery and innominate artery is noted. The study is mildly degraded by patient motion.  Right carotid system: The right common carotid artery is within normal limits. Atherosclerotic calcifications are present at the origin of the right internal carotid artery without significant stenosis. The cervical right ICA is normal.  Left carotid system: The left common carotid artery is within normal limits. Atherosclerotic changes are present at the carotid bifurcation without a significant stenosis relative to the more distal vessel. The more distal cervical ICA is normal.  Vertebral arteries:The vertebral arteries originate from of the subclavian arteries bilaterally. Mild to moderate narrowing is present at proximal left vertebral artery. No focal stenosis or vascular injury is evident within the vertebral arteries.  Skeleton: Mild endplate degenerative changes are present from C3-4 through C5-6. Uncovertebral spurring is worse left than right at C3-4 and C5-6. No focal lytic or blastic lesions are present.  Other neck: The  soft tissues of the neck demonstrate calcifications in the palatine tonsils bilaterally compatible with the history of infection. Lymph nodes are present in the parotid glands bilaterally. Reactive size level 2 and level 3 lymph nodes are present bilaterally. No significant cervical adenopathy is present.  The lung apices are clear  CTA HEAD  Anterior circulation: Minimal atherosclerotic changes are present within the cavernous internal carotid arteries bilaterally without significant stenosis through the ICA termini. The A1 and M1 segments are normal. The MCA bifurcations are intact. There is some attenuation of distal MCA branch vessels, worse on the right.  Posterior circulation: The vertebral  arteries are codominant. PICA origins are visualized and normal bilaterally. A fetal type left posterior cerebral artery is present. The right posterior cerebral artery originates from the basilar tip.  Venous sinuses: The dural sinuses are patent. The straight sinus and deep cerebral veins are intact. Cortical veins are within normal limits.  Anatomic variants: Fetal type left posterior cerebral artery.  Delayed phase: The postcontrast images demonstrate no pathologic enhancement.  IMPRESSION: 1. White matter disease without evidence for acute cortical infarct. 2. Mild attenuation of MCA branch vessels, right greater than left. 3. No significant proximal stenosis, aneurysm, or branch vessel occlusion. 4. Mild atherosclerotic changes within the carotid bifurcations bilaterally without significant stenosis. 5. Degenerative changes in the cervical spine.   Electronically Signed   By: Marin Roberts M.D.   On: 01/04/2015 21:33   Mr Brain Wo Contrast  01/04/2015   CLINICAL DATA:  Acute onset of intermittent aphasia. Episode of right hemiparesis.  EXAM: MRI HEAD WITHOUT CONTRAST  TECHNIQUE: Multiplanar, multiecho pulse sequences of the brain and surrounding structures were obtained without intravenous contrast.  COMPARISON:  CTA head and neck from the same day.  FINDINGS: The diffusion-weighted images demonstrate no evidence for acute or subacute infarction. Mild atrophy and white matter change is slightly advanced for age.  The ventricles are of normal size. A remote left temporal parietal lobe infarct is noted. No significant extraaxial fluid collection is present.  Flow is present in the major intracranial arteries. The globes and orbits are intact. Mild mucosal thickening is present in the anterior ethmoid air cells and frontal sinuses. A sequestered area in the superior left frontal sinus is opacified with osseous changes overlying this area, likely reactive.  The skullbase is within normal limits. Midline  structures are unremarkable.  IMPRESSION: 1. No acute intracranial abnormality. 2. Age advanced atrophy and white matter disease. 3. 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 4. Frontal sinus disease.   Electronically Signed   By: Marin Roberts M.D.   On: 01/04/2015 21:36    Scheduled Meds: . aspirin EC  325 mg Oral Daily  . atorvastatin  80 mg Oral q1800  . clopidogrel  75 mg Oral Daily  . enoxaparin (LOVENOX) injection  40 mg Subcutaneous Q24H  . fluticasone  2 spray Each Nare Daily  . lisinopril  20 mg Oral Daily   Continuous Infusions:    Time spent: 35 minutes  Sianna Garofano L  [facility] [email or web address], password [email or web address] 01/06/2015, 11:15 AM

## 2015-01-06 NOTE — Progress Notes (Signed)
  Echocardiogram 2D Echocardiogram has been performed.  Darrell Knapp 01/06/2015, 10:22 AM

## 2015-01-06 NOTE — Consult Note (Addendum)
Patient ID: Darrell Knapp MRN: [id number], DOB/AGE: 55-Nov-1962   Admit date: 01/04/2015   Primary Physician: No primary care provider on file. Primary Cardiologist: New (followed by a cardiologist in [place])  Pt. Profile:  54 y/o male visiting from [place] with h/o CAD s/p cardiac arrest and CABG x3 2 years ago, HTN and HLD admitted for TIA vs complex partial seizures. Found to have severe LV systolic function on 2D echo. No cardiac thrombus seen on TTE.    Problem List  Past Medical History  Diagnosis Date  . Coronary artery disease   . Cardiac arrest   . Heart attack   . Hypertension     Past Surgical History  Procedure Laterality Date  . Bypass graft       Allergies  No Known Allergies  HPI  54 y/o male visiting [place] from [place]. He is here for work and his wife has traveled with him. He has a reported h/o CAD s/p CABG x3, prior cardiac arrest,  HTN and HLD. He reports seeing his PCP 3 weeks ago for acute onset of dyspnea and fluid retention. He states he was diagnosed with CHF (new diagnosis for him). He was placed on a diuretic. He also recalls his PCP noting something abnormal on his EKG and suggested that he see a specialist for a procedure to ablate an electrical circuit in his heart (sounds like ablation for an arrhthymia). An appointment was scheduled but he has not yet been seen. We are now awaiting his records to be faxed from his PCP and cardiologist in [place].  He was brought to the [facility] ED 01/04/15 after developing right-sided weakness with aphasia and confusion. He denies any prior h/o stroke/TIA.  In ED, CT of head was unremarkable. MRI also w/o any significant findings. He was seen by neurology and placed on ASA and Plavix, however he has continued to have recurrent transient episodes of aphasia and confusion despite therapy. 2D echo was obtained revealing severely reduced LV systolic function, with an EF of 20-25%, with diffuse hypokinesis and akinesis of the entire  anteroseptal and apical myocardium. Wall thickness was increased in a pattern of severe LVH. He has mild mitral regurgitation. The right atrium is mildly dilated. The left atrium is normal in size. There was no mention of any cardiac thrombus.   The patient denies any current chest pain. He noted mild chest tightness 3 weeks ago when he had acute CHF. However his chest pain resolved with diuresis. He states that he did not have typical chest pain prior to his cardiac arrest 2 year ago. He had SCD while exercising but no warning symptoms. He denies any current dyspnea. No LEE. He does endorse a recent history of tachy palpitations. Telemetry currently shows NSR however there is question of possible atrial flutter on prior telemetry strips/ EKGs.       Home Medications  Prior to Admission medications   Medication Sig Start Date End Date Taking? Authorizing Provider  ibuprofen (ADVIL,MOTRIN) 200 MG tablet Take 600 mg by mouth daily as needed for headache.   Yes Historical Provider, MD  lisinopril-hydrochlorothiazide (PRINZIDE,ZESTORETIC) 20-25 MG per tablet Take 1 tablet by mouth daily. 12/16/14  Yes Historical Provider, MD    Family History  Family History  Problem Relation Age of Onset  . Stroke Mother   . Heart attack Father   . Kidney failure Brother     Social History  Social History   Social History  . Marital Status:  Married    Spouse Name: N/A  . Number of Children: N/A  . Years of Education: N/A   Occupational History  . Not on file.   Social History Main Topics  . Smoking status: Never Smoker   . Smokeless tobacco: Not on file  . Alcohol Use: No  . Drug Use: Not on file  . Sexual Activity: Not Currently    Birth Control/ Protection: None   Other Topics Concern  . Not on file   Social History Narrative  . No narrative on file     Review of Systems General:  No chills, fever, night sweats or weight changes.  Cardiovascular:  No chest pain, dyspnea on  exertion, edema, orthopnea, palpitations, paroxysmal nocturnal dyspnea. Dermatological: No rash, lesions/masses Respiratory: No cough, dyspnea Urologic: No hematuria, dysuria Abdominal:   No nausea, vomiting, diarrhea, bright red blood per rectum, melena, or hematemesis Neurologic:  No visual changes, wkns, changes in mental status. All other systems reviewed and are otherwise negative except as noted above.  Physical Exam  Blood pressure 116/73, pulse 68, temperature 97.9 F (36.6 C), temperature source Oral, resp. rate 20, height 5\' 10"  (1.778 m), weight 253 lb 4.8 oz (114.896 kg), SpO2 97 %.  General: Pleasant, NAD Psych: Normal affect. Neuro: Alert and oriented X 3. Moves all extremities spontaneously. HEENT: Normal  Neck: Supple without bruits or JVD. Lungs:  Resp regular and unlabored, CTA. Heart: RRR no s3, s4, or murmurs. Abdomen: Soft, non-tender, non-distended, BS + x 4.  Extremities: No clubbing, cyanosis or edema. DP/PT/Radials 2+ and equal bilaterally.  Labs  Troponin [facility] of Care Test)  Recent Labs  01/04/15 1804  TROPIPOC 0.00   No results for input(s): CKTOTAL, CKMB, TROPONINI in the last 72 hours. Lab Results  Component Value Date   WBC 7.0 01/04/2015   HGB 17.5* 01/04/2015   HCT 50.8 01/04/2015   MCV 88.5 01/04/2015   PLT 308 01/04/2015    Recent Labs Lab 01/05/15 0645  NA 136  K 4.3  CL 100*  CO2 25  BUN 21*  CREATININE 1.29*  CALCIUM 9.1  PROT 7.1  BILITOT 1.2  ALKPHOS 119  ALT 43  AST 35  GLUCOSE 117*   Lab Results  Component Value Date   CHOL 214* 01/05/2015   HDL 24* 01/05/2015   LDLCALC 164* 01/05/2015   TRIG 129 01/05/2015   No results found for: DDIMER   Radiology/Studies  Ct Angio Head W/cm &/or Wo Cm  01/04/2015   CLINICAL DATA:  Episode lasting 45 seconds with acute onset weakness in the right upper and lower extremities. Persistent intermittent aphasia  EXAM: CT ANGIOGRAPHY HEAD AND NECK  TECHNIQUE: Multidetector CT  imaging of the head and neck was performed using the standard protocol during bolus administration of intravenous contrast. Multiplanar CT image reconstructions and MIPs were obtained to evaluate the vascular anatomy. Carotid stenosis measurements (when applicable) are obtained utilizing NASCET criteria, using the distal internal carotid diameter as the denominator.  CONTRAST:  OMNIPAQUE IOHEXOL 350 MG/ML SOLN  COMPARISON:  CT head without contrast from the same day.  FINDINGS: CT HEAD  Brain: The source images demonstrate no evidence for acute or subacute infarction. Basal ganglia are intact. The insular ribbon is intact. No acute cortical infarct is present.  Calvarium and skull base: Within normal limits.  Paranasal sinuses: Mild mucosal thickening is present in the frontal sinuses bilaterally. The paranasal sinuses and mastoid air cells are otherwise clear.  Orbits: Negative  CTA NECK  Aortic arch: A common origin of the left common carotid artery and innominate artery is noted. The study is mildly degraded by patient motion.  Right carotid system: The right common carotid artery is within normal limits. Atherosclerotic calcifications are present at the origin of the right internal carotid artery without significant stenosis. The cervical right ICA is normal.  Left carotid system: The left common carotid artery is within normal limits. Atherosclerotic changes are present at the carotid bifurcation without a significant stenosis relative to the more distal vessel. The more distal cervical ICA is normal.  Vertebral arteries:The vertebral arteries originate from of the subclavian arteries bilaterally. Mild to moderate narrowing is present at proximal left vertebral artery. No focal stenosis or vascular injury is evident within the vertebral arteries.  Skeleton: Mild endplate degenerative changes are present from C3-4 through C5-6. Uncovertebral spurring is worse left than right at C3-4 and C5-6. No focal  lytic or blastic lesions are present.  Other neck: The soft tissues of the neck demonstrate calcifications in the palatine tonsils bilaterally compatible with the history of infection. Lymph nodes are present in the parotid glands bilaterally. Reactive size level 2 and level 3 lymph nodes are present bilaterally. No significant cervical adenopathy is present.  The lung apices are clear  CTA HEAD  Anterior circulation: Minimal atherosclerotic changes are present within the cavernous internal carotid arteries bilaterally without significant stenosis through the ICA termini. The A1 and M1 segments are normal. The MCA bifurcations are intact. There is some attenuation of distal MCA branch vessels, worse on the right.  Posterior circulation: The vertebral arteries are codominant. PICA origins are visualized and normal bilaterally. A fetal type left posterior cerebral artery is present. The right posterior cerebral artery originates from the basilar tip.  Venous sinuses: The dural sinuses are patent. The straight sinus and deep cerebral veins are intact. Cortical veins are within normal limits.  Anatomic variants: Fetal type left posterior cerebral artery.  Delayed phase: The postcontrast images demonstrate no pathologic enhancement.  IMPRESSION: 1. White matter disease without evidence for acute cortical infarct. 2. Mild attenuation of MCA branch vessels, right greater than left. 3. No significant proximal stenosis, aneurysm, or branch vessel occlusion. 4. Mild atherosclerotic changes within the carotid bifurcations bilaterally without significant stenosis. 5. Degenerative changes in the cervical spine.   Electronically Signed   By: Marin Roberts M.D.   On: 01/04/2015 21:33   Dg Chest 2 View  01/05/2015   CLINICAL DATA:  Recent TIAs  EXAM: CHEST - 2 VIEW  COMPARISON:  None.  FINDINGS: Cardiac shadow is within normal limits. Postsurgical changes are seen. The lungs are well aerated bilaterally. No bony  abnormality is seen.  IMPRESSION: No active disease.   Electronically Signed   By: Alcide Clever M.D.   On: 01/05/2015 09:15   Ct Head Wo Contrast  01/04/2015   CLINICAL DATA:  Right-sided weakness and aphasia.  Code stroke.  EXAM: CT HEAD WITHOUT CONTRAST  TECHNIQUE: Contiguous axial images were obtained from the base of the skull through the vertex without intravenous contrast.  COMPARISON:  None.  FINDINGS: No acute cortical infarct, hemorrhage, or mass lesion is present. The ventricles are of normal size. No significant extra-axial fluid collection is evident. Focal opacity in the superior left frontal lobe appears separated by septation. There is surrounding reactive bone change. Paranasal sinuses are otherwise clear. The mastoid air cells are clear.  ASPECTS score = 10/10  [place] Stroke Program Early CT  Score  Normal score = 10  IMPRESSION: 1. Normal CT appearance the brain. 2. Opacification of the superior aspect of the left frontal sinus with surrounding reactive bone change. This appears to be a chronic process. Recommend correlation with the patient's symptoms.  These results were called by telephone at the time of interpretation on 01/04/2015 at 6:17 pm to Dr. Noel Christmas , who verbally acknowledged these results.   Electronically Signed   By: Marin Roberts M.D.   On: 01/04/2015 18:29   Ct Angio Neck W/cm &/or Wo/cm  01/04/2015   CLINICAL DATA:  Episode lasting 45 seconds with acute onset weakness in the right upper and lower extremities. Persistent intermittent aphasia  EXAM: CT ANGIOGRAPHY HEAD AND NECK  TECHNIQUE: Multidetector CT imaging of the head and neck was performed using the standard protocol during bolus administration of intravenous contrast. Multiplanar CT image reconstructions and MIPs were obtained to evaluate the vascular anatomy. Carotid stenosis measurements (when applicable) are obtained utilizing NASCET criteria, using the distal internal carotid diameter as the  denominator.  CONTRAST:  OMNIPAQUE IOHEXOL 350 MG/ML SOLN  COMPARISON:  CT head without contrast from the same day.  FINDINGS: CT HEAD  Brain: The source images demonstrate no evidence for acute or subacute infarction. Basal ganglia are intact. The insular ribbon is intact. No acute cortical infarct is present.  Calvarium and skull base: Within normal limits.  Paranasal sinuses: Mild mucosal thickening is present in the frontal sinuses bilaterally. The paranasal sinuses and mastoid air cells are otherwise clear.  Orbits: Negative  CTA NECK  Aortic arch: A common origin of the left common carotid artery and innominate artery is noted. The study is mildly degraded by patient motion.  Right carotid system: The right common carotid artery is within normal limits. Atherosclerotic calcifications are present at the origin of the right internal carotid artery without significant stenosis. The cervical right ICA is normal.  Left carotid system: The left common carotid artery is within normal limits. Atherosclerotic changes are present at the carotid bifurcation without a significant stenosis relative to the more distal vessel. The more distal cervical ICA is normal.  Vertebral arteries:The vertebral arteries originate from of the subclavian arteries bilaterally. Mild to moderate narrowing is present at proximal left vertebral artery. No focal stenosis or vascular injury is evident within the vertebral arteries.  Skeleton: Mild endplate degenerative changes are present from C3-4 through C5-6. Uncovertebral spurring is worse left than right at C3-4 and C5-6. No focal lytic or blastic lesions are present.  Other neck: The soft tissues of the neck demonstrate calcifications in the palatine tonsils bilaterally compatible with the history of infection. Lymph nodes are present in the parotid glands bilaterally. Reactive size level 2 and level 3 lymph nodes are present bilaterally. No significant cervical adenopathy is  present.  The lung apices are clear  CTA HEAD  Anterior circulation: Minimal atherosclerotic changes are present within the cavernous internal carotid arteries bilaterally without significant stenosis through the ICA termini. The A1 and M1 segments are normal. The MCA bifurcations are intact. There is some attenuation of distal MCA branch vessels, worse on the right.  Posterior circulation: The vertebral arteries are codominant. PICA origins are visualized and normal bilaterally. A fetal type left posterior cerebral artery is present. The right posterior cerebral artery originates from the basilar tip.  Venous sinuses: The dural sinuses are patent. The straight sinus and deep cerebral veins are intact. Cortical veins are within normal limits.  Anatomic variants:  Fetal type left posterior cerebral artery.  Delayed phase: The postcontrast images demonstrate no pathologic enhancement.  IMPRESSION: 1. White matter disease without evidence for acute cortical infarct. 2. Mild attenuation of MCA branch vessels, right greater than left. 3. No significant proximal stenosis, aneurysm, or branch vessel occlusion. 4. Mild atherosclerotic changes within the carotid bifurcations bilaterally without significant stenosis. 5. Degenerative changes in the cervical spine.   Electronically Signed   By: Marin Roberts M.D.   On: 01/04/2015 21:33   Mr Brain Wo Contrast  01/04/2015   CLINICAL DATA:  Acute onset of intermittent aphasia. Episode of right hemiparesis.  EXAM: MRI HEAD WITHOUT CONTRAST  TECHNIQUE: Multiplanar, multiecho pulse sequences of the brain and surrounding structures were obtained without intravenous contrast.  COMPARISON:  CTA head and neck from the same day.  FINDINGS: The diffusion-weighted images demonstrate no evidence for acute or subacute infarction. Mild atrophy and white matter change is slightly advanced for age.  The ventricles are of normal size. A remote left temporal parietal lobe infarct is  noted. No significant extraaxial fluid collection is present.  Flow is present in the major intracranial arteries. The globes and orbits are intact. Mild mucosal thickening is present in the anterior ethmoid air cells and frontal sinuses. A sequestered area in the superior left frontal sinus is opacified with osseous changes overlying this area, likely reactive.  The skullbase is within normal limits. Midline structures are unremarkable.  IMPRESSION: 1. No acute intracranial abnormality. 2. Age advanced atrophy and white matter disease. 3. 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 4. Frontal sinus disease.   Electronically Signed   By: Marin Roberts M.D.   On: 01/04/2015 21:36    ECG  RBBB  Echocardiogram 01/06/15  Study Conclusions  - Procedure narrative: Transthoracic echocardiography. Image quality was suboptimal. The study was technically difficult, as a result of poor acoustic windows, poor sound wave transmission, and body habitus. - Left ventricle: The cavity size was mildly dilated. Wall thickness was increased in a pattern of severe LVH. Systolic function was severely reduced. The estimated ejection fraction was in the range of 20% to 25%. Diffuse hypokinesis. Akinesis of the entireanteroseptal and apical myocardium. - Mitral valve: There was mild regurgitation. - Right atrium: The atrium was mildly dilated.    ASSESSMENT AND PLAN  Principal Problem:   TIA (transient ischemic attack) Active Problems:   Hypertension   CAD (coronary artery disease)   Renal failure   Aphasia   Sinusitis, chronic  1. ? TIA vs seizure: patient continues to have recurrent transient aphasia and confusion, despite ASA and Plavix therapy. No carotid disease on CT of neck. No cardiac embolic sources identified on TTE. Plus he had a normal MRI which makes embolus from cardiac source less likely. Doubt much benefit from TEE. Continue management per neurology.  May also potentially need a loop recorder implanted to assess for arhythmias. For now continue to monitor on telemetry.  2. LV Systolic Dysfunction: Patient notes new diagnosis of CHF 3 weeks ago by PCP. 2D echo here shows severe LV dysfunction with EF at 20-25%. He is euvolemic on physical exam w/o dyspnea. Continue ACE-I therapy with lisinopril.  Add BB therapy if BP and HR can tolerate. Low sodium diet. Will need further eval to r/o ischemia as potential cause of his new cardiomyopathy. 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  3. CAD: h/o SCD and CABG x 3 two years  ago. Now with new cardiomyopathy with EF at 20-25%. Given history of silent ischemia, SCD and CABG, will need an ischemic eval to rule out graft failure. Patient plans to stay for cath on Monday. Continue ASA, Plavix and statin for now.    Beau Fanny, PA-C 01/06/2015, 3:17 PM   Patient seen and examined with Robbie Lis PA-C. We discussed all aspects of the encounter. I agree with the assessment and plan as stated above. He has h/o CAD s/p CABG with previous cardiac arrest. Unaware of previous EF (has not seen a cardiologist in 2 years. His PCP said he might have an irregular heart rhtyhm.) He presents with several episodes of aphasia and RUE weakness. MRI brain and CT angio head/neck have been completely negative. Echo shows EF 20-25% - unclear if this is new or old but has had recent onset of HF symptoms.   Would recommend cardiac cath to assess coronaries and grafts as cause of new LV dysfunction. Continue to follow on tele and if no arrhythmias consider outpatient montior when he returns to [place]. TIA work-up per Neurology doubt cardioembolic source with negative MRI but will schedule TEE if Neurology feels it will be helpful.   Start b-blocker. Continue lisinopril, statin and ASA.   Bensimhon, Daniel,MD 5:41 PM

## 2015-01-06 NOTE — Progress Notes (Signed)
STROKE TEAM PROGRESS NOTE   HISTORY Darrell Knapp is a 53 y.o. male with a history of hypertension, hyperlipidemia and coronary artery disease with myocardial infarction, presenting with acute onset of aphasia as well as right hemiparesis. Patient had difficulty formulating what he wanted to say as well as noticeable errors in speech output. Symptoms lasted for a few minutes then resolved. He has no previous history of stroke nor TIA. He has not been on and a platelet therapy. CT scan of his head showed no acute intracranial abnormality. NIH score at the time of this evaluation was 0.  LSN: 4:00 PM on 01/04/2015 tPA Given: No: Deficits resolved mRankin:   SUBJECTIVE (INTERVAL HISTORY) Wife present at bedside. The patient feels he is back to baseline. He is from [facility] and was in [place] on business when he became symptomatic. The patient has a history of coronary artery bypass graft surgery and apparently had atrial fibrillation postoperatively. He was seen by his cardiologist 3 weeks ago for congestive heart failure. He had a brief episode of transient expressive aphasia this morning while walking on the unit with the nurse. This lasted about 2-3 minutes and recovered by the time I was notified and went in to examine the patient and his speech was back to normal. 2-D echo has been done this morning and results are pending   OBJECTIVE Temp:  [97.5 F (36.4 C)-98.3 F (36.8 C)] 98.3 F (36.8 C) (08/12 1124) Pulse Rate:  [64-99] 77 (08/12 1124) Cardiac Rhythm:  [-] Other (Comment) (08/12 0800) Resp:  [16-24] 20 (08/12 1124) BP: (100-137)/(62-96) 122/71 mmHg (08/12 1124) SpO2:  [97 %-100 %] 97 % (08/12 1124)   Recent Labs Lab 01/04/15 1831  GLUCAP 119*    Recent Labs Lab 01/04/15 1752 01/04/15 1800 01/05/15 0645  NA 137 136 136  K 4.4 4.2 4.3  CL 99* 98* 100*  CO2  --  26 25  GLUCOSE 121* 121* 117*  BUN 35* 24* 21*  CREATININE 1.40* 1.49* 1.29*  CALCIUM  --   9.5 9.1    Recent Labs Lab 01/04/15 1800 01/05/15 0645  AST 35 35  ALT 51 43  ALKPHOS 131* 119  BILITOT 1.2 1.2  PROT 7.4 7.1  ALBUMIN 3.8 3.4*    Recent Labs Lab 01/04/15 1752 01/04/15 1800  WBC  --  7.0  NEUTROABS  --  4.4  HGB 19.0* 17.5*  HCT 56.0* 50.8  MCV  --  88.5  PLT  --  308   No results for input(s): CKTOTAL, CKMB, CKMBINDEX, TROPONINI in the last 168 hours.  Recent Labs  01/04/15 1800  LABPROT 14.6  INR 1.12   No results for input(s): COLORURINE, LABSPEC, PHURINE, GLUCOSEU, HGBUR, BILIRUBINUR, KETONESUR, PROTEINUR, UROBILINOGEN, NITRITE, LEUKOCYTESUR in the last 72 hours.  Invalid input(s): APPERANCEUR     Component Value Date/Time   CHOL 214* 01/05/2015 0645   TRIG 129 01/05/2015 0645   HDL 24* 01/05/2015 0645   CHOLHDL 8.9 01/05/2015 0645   VLDL 26 01/05/2015 0645   LDLCALC 164* 01/05/2015 0645   Lab Results  Component Value Date   HGBA1C 7.0* 01/05/2015      Component Value Date/Time   LABOPIA NONE DETECTED 01/05/2015 1435   COCAINSCRNUR NONE DETECTED 01/05/2015 1435   LABBENZ NONE DETECTED 01/05/2015 1435   AMPHETMU NONE DETECTED 01/05/2015 1435   THCU NONE DETECTED 01/05/2015 1435   LABBARB NONE DETECTED 01/05/2015 1435    No results for input(s): ETH in the  last 168 hours.   IMAGING  Ct Angio Head and Neck W/cm &/or Wo Cm 01/04/2015    1. White matter disease without evidence for acute cortical infarct.  2. Mild attenuation of MCA branch vessels, right greater than left.  3. No significant proximal stenosis, aneurysm, or branch vessel occlusion.  4. Mild atherosclerotic changes within the carotid bifurcations bilaterally without significant stenosis.  5. Degenerative changes in the cervical spine.      Ct Head Wo Contrast 01/04/2015    1. Normal CT appearance the brain.  2. Opacification of the superior aspect of the left frontal sinus with surrounding reactive bone change. This appears to be a chronic process. Recommend  correlation with the patient's symptoms.       Mr Brain Wo Contrast 01/04/2015    1. No acute intracranial abnormality.  2. Age advanced atrophy and white matter disease.  3. 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  4. Frontal sinus disease.       PHYSICAL EXAM Pleasant middle aged mildly obese Caucasian male not in distress. . Afebrile. Head is nontraumatic. Neck is supple without bruit.    Cardiac exam no murmur or gallop. Lungs are clear to auscultation. Distal pulses are well felt.  Neurological Exam :  Awake alert oriented. No aphasia, dysarthria or apraxia. Follows commands well. Intact attention, registration and recall. Pupils equal reactive. Fundi were not visualized. Vision acuity and fields seem adequate. Face is symmetric without weakness. Tongue is midline. Good cough and gag. Motor system exam revealed no upper or lower extremity drift. Symmetric and equal strength in all 4 extremities. Deep tendon reflexes are 1+ symmetric. Plantars are downgoing. Sensation is intact. Gait was not tested. Deep tendon reflexes are 1+ symmetric. Plantars are downgoing. Sensation appears intact bilaterally. Gait was not tested.   ASSESSMENT/PLAN [name] is a 54 y.o. male with history of hypertension, previous CABG with postop atrial fibrillation, CHF, hyperlipidemia, and previous MI,  presenting with transient episodes of speech difficulties, confusion, and bilateral upper extremity weakness.  He did not receive IV t-PA due to resolution of deficits.  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  Resultant  resolution of deficits  MRI  no acute intracranial abnormality. Remote left temporoparietal infarct.  MRA  not performed  CTA of head and neck - no high-grade stenosis.  Carotid Doppler - see CTA of neck.  2D Echo - EF 20-25 5 diffuse hypokinesis. No definite clot but suboptimal quality  study  LDL - 164  HgbA1c pending  Telemetry - NSR so far.  Admission ECG - ST rate 101  Lovenox for VTE prophylaxis Diet Heart Room service appropriate?: Yes; Fluid consistency:: Thin  no antithrombotic prior to admission, now on clopidogrel 75 mg orally every day  Patient counseled to be compliant with his antithrombotic medications  Ongoing aggressive stroke risk factor management  Therapy recommendations: No follow-up physical therapy  Disposition: Pending  Hypertension  Home meds: Lisinopril and hydrochlorothiazide  Stable  Patient counseled to be compliant with his blood pressure medications  Hyperlipidemia  Home meds:  No lipid lowering medications prior to admission  LDL 164, goal < 70  Add Lipitor 80 mg daily  Continue statin at discharge  Diabetes  HgbA1c pending, goal < 7.0  No history of diabetes mellitus  Other Stroke Risk Factors  Obesity, Body mass index is 36.34 kg/(m^2).   Hx of stroke by MRI  Family  hx stroke (mother)  Coronary artery disease   Other Active Problems  Mild renal insufficiency (recently placed on ACE inhibitor and hydrochlorothiazide)  Other Pertinent History   PLAN  Continue stroke workup  Await EEG  Possible TEE and loop when patient returns to [facility] if workup here is unrevealing.   Hospital day #   Delton See PA-C [facility] Pager [phone] 01/06/2015, 12:57 PM I have personally examined this patient, reviewed notes, independently viewed imaging studies, participated in medical decision making and plan of care. I have made any additions or clarifications directly to the above note. Agree with note above. He  has presented with. Recurrent transient symptoms of expressive language difficulties as well as some altered consciousness and memory loss possibly left brain TIA  Etiology likely cardioembolic given echocardiogram findings however there is no definite clot noted.  Recommend reviewing prior cardiac records from [place] as well as current cardiology consult to consider doing a TEE and if definite clot is found may consider anticoagulation long-term.He  remains at risk for neurological worsening, recurrent stroke/TIAs and needs ongoing stroke evaluation and aggressive risk factor modification.   He will also need prolonged cardiac monitoring for paroxysmalg atrial fibrillation detection but since the patient lives out of the area this could be done in [place] where he lives. Continue aspirin  and plavix for now.  Delia Heady, MD Medical Director [facility] Pager: [phone] 01/06/2015 12:57 PM     To contact Stroke Continuity provider, please refer to [email or web address]. After hours, contact General Neurology

## 2015-01-07 DIAGNOSIS — R4701 Aphasia: Secondary | ICD-10-CM | POA: Diagnosis not present

## 2015-01-07 DIAGNOSIS — J328 Other chronic sinusitis: Secondary | ICD-10-CM | POA: Diagnosis not present

## 2015-01-07 DIAGNOSIS — G40219 Localization-related (focal) (partial) symptomatic epilepsy and epileptic syndromes with complex partial seizures, intractable, without status epilepticus: Secondary | ICD-10-CM | POA: Diagnosis not present

## 2015-01-07 DIAGNOSIS — G451 Carotid artery syndrome (hemispheric): Secondary | ICD-10-CM | POA: Diagnosis not present

## 2015-01-07 MED ORDER — SODIUM CHLORIDE 0.9 % IV SOLN
1000.0000 mg | Freq: Once | INTRAVENOUS | Status: AC
  Administered 2015-01-07: 1000 mg via INTRAVENOUS
  Filled 2015-01-07: qty 10

## 2015-01-07 MED ORDER — LEVETIRACETAM 500 MG PO TABS: 500.0000 mg | ORAL_TABLET | Freq: Two times a day (BID) | ORAL | Status: DC

## 2015-01-07 MED ORDER — LEVETIRACETAM 500 MG PO TABS
500.0000 mg | ORAL_TABLET | Freq: Two times a day (BID) | ORAL | Status: DC
  Administered 2015-01-07 – 2015-01-10 (×5): 500 mg via ORAL
  Filled 2015-01-07 (×6): qty 1

## 2015-01-07 NOTE — Progress Notes (Signed)
   Patient off floor at EEG.  Telemetry reviewed. Multiple PACs and occasional PVCs but no AF.   On atorva, plavix, asa, carvedilol and lisinopril. Plan cath Monday to assess for graft closure in setting of decreased EF.   Will defer decision for TEE to neurology. With normal MRI hard to attribute neuro symptoms to cardi-embolism. He seems to be under a lot of stress and I worry that anxiety may be contributing to his sx.   We will see again on Monday.   Bensimhon, Daniel,MD 2:22 PM

## 2015-01-07 NOTE — Progress Notes (Signed)
[facility] PROGRESS NOTE  Darrell Knapp ZOX:[id number] DOB: August 19, 1960 DOA: 01/04/2015 PCP: No primary care provider on file.  Called by nursing staff that patient had sudden episode of expressive aphasia while walking the unit.  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   LDL 164. Started statin. On ASA.  Echo shows ejection fraction of 20% but no source of embolus.  EEG negative.  hgb a1c 7. Neurology has started Keppra in case complex partial seizures. Active Problems:  ischemic cardiomyopathy: Already on ACE inhibitor. Beta blocker started. For cath on Monday. Appreciate cardiology.   Hypertension   CAD (coronary artery disease)   Renal insufficiency: baseline unknown   Aphasia   Sinusitis, chronic: add flonase. Patient reports improvement in symptoms.  HPI/Subjective: No new complaints. No recurrence of aphasia.  Objective: Filed Vitals:   01/07/15 0930  BP: 114/67  Pulse: 69  Temp: 98.1 F (36.7 C)  Resp: 18    Intake/Output Summary (Last 24 hours) at 01/07/15 1249 Last data filed at 01/06/15 1601  Gross per 24 hour  Intake      0 ml  Output    600 ml  Net   -600 ml   Filed Weights   01/04/15 1800 01/04/15 2300  Weight: 117.8 kg (259 lb 11.2 oz) 114.896 kg (253 lb 4.8 oz)    Exam:   General:  Alert, oriented. Working from his computer.  Cardiovascular: RRR without MGR  Respiratory: CTA without WRR  Abdomen: S, NT, ND  Ext: no CCE  Neuro: Speech clear and fluent. Motor strength intact.  Basic Metabolic Panel:  Recent Labs Lab 01/04/15 1752 01/04/15 1800 01/05/15 0645 01/06/15 1235  NA 137 136 136  --   K 4.4 4.2 4.3  --   CL 99* 98* 100*  --   CO2  --  26 25  --   GLUCOSE 121* 121* 117*  --   BUN 35* 24* 21*  --   CREATININE 1.40* 1.49* 1.29*  --   CALCIUM  --  9.5 9.1  --   MG  --   --   --  2.0   Liver Function Tests:  Recent Labs Lab  01/04/15 1800 01/05/15 0645  AST 35 35  ALT 51 43  ALKPHOS 131* 119  BILITOT 1.2 1.2  PROT 7.4 7.1  ALBUMIN 3.8 3.4*   No results for input(s): LIPASE, AMYLASE in the last 168 hours. No results for input(s): AMMONIA in the last 168 hours. CBC:  Recent Labs Lab 01/04/15 1752 01/04/15 1800  WBC  --  7.0  NEUTROABS  --  4.4  HGB 19.0* 17.5*  HCT 56.0* 50.8  MCV  --  88.5  PLT  --  308   Cardiac Enzymes: No results for input(s): CKTOTAL, CKMB, CKMBINDEX, TROPONINI in the last 168 hours. BNP (last 3 results)  Recent Labs  01/06/15 1233  BNP 147.9*    ProBNP (last 3 results) No results for input(s): PROBNP in the last 8760 hours.  CBG:  Recent Labs Lab 01/04/15 1831  GLUCAP 119*    No results found for this or any previous visit (from the past 240 hour(s)).   Studies: No results found.  Scheduled Meds: . aspirin EC  325 mg Oral Daily  . atorvastatin  80 mg Oral q1800  . carvedilol  3.125 mg Oral BID WC  . clopidogrel  75 mg Oral Daily  . enoxaparin (LOVENOX) injection  40  mg Subcutaneous Q24H  . fluticasone  2 spray Each Nare Daily  . levETIRAcetam  1,000 mg Intravenous Once  . levETIRAcetam  500 mg Oral BID  . lisinopril  20 mg Oral Daily   Continuous Infusions:    Time spent: 35 minutes  Darrell Knapp L  [facility] [email or web address], password [facility] 01/07/2015, 12:49 PM

## 2015-01-07 NOTE — Progress Notes (Signed)
STROKE TEAM PROGRESS NOTE   HISTORY Darrell Knapp is a 54 y.o. male with a history of hypertension, hyperlipidemia and coronary artery disease with myocardial infarction, presenting with acute onset of aphasia as well as right hemiparesis. Patient had difficulty formulating what he wanted to say as well as noticeable errors in speech output. Symptoms lasted for a few minutes then resolved. He has no previous history of stroke nor TIA. He has not been on and a platelet therapy. CT scan of his head showed no acute intracranial abnormality. NIH score at the time of this evaluation was 0.  LSN: 4:00 PM on 01/04/2015 tPA Given: No: Deficits resolved mRankin:   SUBJECTIVE (INTERVAL HISTORY) Wife is at bedside with the patient. The patient reports having 4 spells before coming to the hospital. He subsequently has had about 3 prior to coming to the hospital. The initial event is described as tonic flexion of the right hand/wrist associated with numbness. The patient also did have diaphoresis with the initial event and was confused afterwards. These subsequent events tend to be confusion and aphasia as a prominent symptoms.   OBJECTIVE Temp:  [97.7 F (36.5 C)-98.1 F (36.7 C)] 98.1 F (36.7 C) (08/13 0930) Pulse Rate:  [59-82] 69 (08/13 0930) Cardiac Rhythm:  [-]  Resp:  [18-20] 18 (08/13 0930) BP: (107-123)/(67-75) 114/67 mmHg (08/13 0930) SpO2:  [96 %-98 %] 98 % (08/13 0550)   Recent Labs Lab 01/04/15 1831  GLUCAP 119*    Recent Labs Lab 01/04/15 1752 01/04/15 1800 01/05/15 0645 01/06/15 1235  NA 137 136 136  --   K 4.4 4.2 4.3  --   CL 99* 98* 100*  --   CO2  --  26 25  --   GLUCOSE 121* 121* 117*  --   BUN 35* 24* 21*  --   CREATININE 1.40* 1.49* 1.29*  --   CALCIUM  --  9.5 9.1  --   MG  --   --   --  2.0    Recent Labs Lab 01/04/15 1800 01/05/15 0645  AST 35 35  ALT 51 43  ALKPHOS 131* 119  BILITOT 1.2 1.2  PROT 7.4 7.1  ALBUMIN 3.8 3.4*    Recent Labs Lab  01/04/15 1752 01/04/15 1800  WBC  --  7.0  NEUTROABS  --  4.4  HGB 19.0* 17.5*  HCT 56.0* 50.8  MCV  --  88.5  PLT  --  308   No results for input(s): CKTOTAL, CKMB, CKMBINDEX, TROPONINI in the last 168 hours.  Recent Labs  01/04/15 1800  LABPROT 14.6  INR 1.12   No results for input(s): COLORURINE, LABSPEC, PHURINE, GLUCOSEU, HGBUR, BILIRUBINUR, KETONESUR, PROTEINUR, UROBILINOGEN, NITRITE, LEUKOCYTESUR in the last 72 hours.  Invalid input(s): APPERANCEUR     Component Value Date/Time   CHOL 214* 01/05/2015 0645   TRIG 129 01/05/2015 0645   HDL 24* 01/05/2015 0645   CHOLHDL 8.9 01/05/2015 0645   VLDL 26 01/05/2015 0645   LDLCALC 164* 01/05/2015 0645   Lab Results  Component Value Date   HGBA1C 7.0* 01/05/2015      Component Value Date/Time   LABOPIA NONE DETECTED 01/05/2015 1435   COCAINSCRNUR NONE DETECTED 01/05/2015 1435   LABBENZ NONE DETECTED 01/05/2015 1435   AMPHETMU NONE DETECTED 01/05/2015 1435   THCU NONE DETECTED 01/05/2015 1435   LABBARB NONE DETECTED 01/05/2015 1435    No results for input(s): ETH in the last 168 hours.   IMAGING  Ct Angio  Head and Neck W/cm &/or Wo Cm 01/04/2015    1. White matter disease without evidence for acute cortical infarct.  2. Mild attenuation of MCA branch vessels, right greater than left.  3. No significant proximal stenosis, aneurysm, or branch vessel occlusion.  4. Mild atherosclerotic changes within the carotid bifurcations bilaterally without significant stenosis.  5. Degenerative changes in the cervical spine.      Ct Head Wo Contrast 01/04/2015    1. Normal CT appearance the brain.  2. Opacification of the superior aspect of the left frontal sinus with surrounding reactive bone change. This appears to be a chronic process. Recommend correlation with the patient's symptoms.       Mr Brain Wo Contrast 01/04/2015    1. No acute intracranial abnormality.  2. Age advanced atrophy and white matter disease.  3.  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  4. Frontal sinus disease.     2-D echocardiogram 01/06/2015 EF 20-25% with diffuse hypokinesis and akinesis. No definite cardiac source of emboli identified.    PHYSICAL EXAM Pleasant middle aged mildly obese Caucasian male not in distress. . Afebrile. Head is nontraumatic. Neck is supple without bruit.    Cardiac exam no murmur or gallop. Lungs are clear to auscultation. Distal pulses are well felt.  Neurological Exam :  Awake alert oriented. No aphasia, dysarthria or apraxia. Follows commands well. Intact attention, registration and recall. Pupils equal reactive. Fundi were not visualized. Vision acuity and fields seem adequate. Face is symmetric without weakness. Tongue is midline. Good cough and gag. Motor system exam revealed no upper or lower extremity drift. Symmetric and equal strength in all 4 extremities. Deep tendon reflexes are 1+ symmetric. Plantars are downgoing. Sensation is intact. Gait was not tested. Deep tendon reflexes are 1+ symmetric. Plantars are downgoing. Sensation appears intact bilaterally. Gait was not tested.  The brain MRI is reviewed in person. There is minimal deep white matter tiny white matter lesions. No acute infarcts are seen on diffusion imaging. Flare imaging shows a small cortical infarct involving the left temporal region.   ASSESSMENT/PLAN [name] is a 54 y.o. male with history of hypertension, previous CABG with postop atrial fibrillation, CHF, hyperlipidemia, and previous MI,  presenting with transient episodes of speech difficulties, confusion, and bilateral upper extremity weakness.  He did not receive IV t-PA due to resolution of deficits.  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 (( Semiology of the events seems most consistent with complex partial seizures and less likely ischemic given the negative imaging for  acute infarct or significant intracranial occlusive disease)). The patient will therefore be started on Keppra.   Resultant  resolution of deficits  MRI  no acute intracranial abnormality. Remote left temporoparietal infarct.  MRA  not performed  CTA of head and neck - no high-grade stenosis.  Carotid Doppler - see CTA of neck.  2D Echo - EF 20-25 5 diffuse hypokinesis. No definite clot but suboptimal quality study  LDL - 164  EEG - normal  HgbA1c 7.0  Telemetry - NSR so far.  Admission ECG - ST rate 101  Lovenox for VTE prophylaxis Diet Heart Room service appropriate?: Yes; Fluid consistency:: Thin  no antithrombotic prior to admission, now on clopidogrel 75 mg orally every day  Patient counseled to be compliant with his antithrombotic medications  Ongoing aggressive stroke risk factor management  Therapy recommendations: No follow-up physical therapy  Disposition:  Pending  Hypertension  Home meds: Lisinopril and hydrochlorothiazide  Stable  Patient counseled to be compliant with his blood pressure medications  Hyperlipidemia  Home meds:  No lipid lowering medications prior to admission  LDL 164, goal < 70  Add Lipitor 80 mg daily  Continue statin at discharge  Diabetes  HgbA1c 7.0, goal < 7.0  No history of diabetes mellitus  Other Stroke Risk Factors  Obesity, Body mass index is 36.34 kg/(m^2).   Hx of stroke by MRI  Family hx stroke (mother)  Coronary artery disease   Other Active Problems  Mild renal insufficiency (recently placed on ACE inhibitor and hydrochlorothiazide)  Transient episode of aphasia while on 4 N.   Other Pertinent History   PLAN  Continue stroke workup  Appreciate cardiology assistance. Cardiac catheterization planned for Monday. Beta blocker added.  Cardiology defers decision regarding TEE to neurology.  If no documented atrial fib on telemetry here consider outpatient monitor when he returns to  [place].  Thousand milligrams of Keppra loading dose followed by 500 mg twice a day orally.   Hospital day #   Delton See PA-C [facility] Pager [phone] 01/07/2015, 11:34 AM    I have personally examined this patient, reviewed notes, independently viewed imaging studies, participated in medical decision making and plan of care. I have made any additions or clarifications directly to the above note. Agree with note above. He will also need prolonged cardiac monitoring for paroxysmalg atrial fibrillation detection but since the patient lives out of the area this could be done in [place] where he lives. Continue aspirin  and plavix for now. Keppra initiated for likely but partial seizures.      To contact Stroke Continuity provider, please refer to [email or web address]. After hours, contact General Neurology

## 2015-01-08 DIAGNOSIS — G459 Transient cerebral ischemic attack, unspecified: Principal | ICD-10-CM

## 2015-01-08 DIAGNOSIS — J321 Chronic frontal sinusitis: Secondary | ICD-10-CM | POA: Diagnosis not present

## 2015-01-08 DIAGNOSIS — G40219 Localization-related (focal) (partial) symptomatic epilepsy and epileptic syndromes with complex partial seizures, intractable, without status epilepticus: Secondary | ICD-10-CM | POA: Diagnosis not present

## 2015-01-08 NOTE — Progress Notes (Signed)
[facility] PROGRESS NOTE  Darrell Knapp OMV:[id number] DOB: 01/09/1961 DOA: 01/04/2015 PCP: Darrell Knapp.  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   LDL 164. Started statin. On ASA.  Echo shows ejection fraction of 20% but Darrell source of embolus.  EEG negative.  hgb a1c 7. Neurology has started Keppra in case complex partial seizures. Active Problems:  ischemic cardiomyopathy: Already on ACE inhibitor. Beta blocker started. For cath on Monday. Appreciate cardiology.   Hypertension   CAD (coronary artery disease)   Renal insufficiency: baseline unknown   Aphasia   Sinusitis, chronic: add flonase. Patient reports improvement in symptoms.  HPI/Subjective: Darrell new complaints. Darrell recurrence of aphasia.  Objective: Filed Vitals:   01/08/15 0940  BP: 126/99  Pulse: 69  Temp: 97.7 F (36.5 C)  Resp: 18   Darrell intake or output data in the 24 hours ending 01/08/15 1201 Filed Weights   01/04/15 1800 01/04/15 2300  Weight: 117.8 kg (259 lb 11.2 oz) 114.896 kg (253 lb 4.8 oz)    Exam:   General:  Alert, oriented. Noted walking in halls  Cardiovascular: RRR without MGR  Respiratory: CTA without WRR  Abdomen: S, NT, ND  Ext: Darrell CCE  Neuro: Speech clear and fluent. Motor strength intact.  Basic Metabolic Panel:  Recent Labs Lab 01/04/15 1752 01/04/15 1800 01/05/15 0645 01/06/15 1235  NA 137 136 136  --   K 4.4 4.2 4.3  --   CL 99* 98* 100*  --   CO2  --  26 25  --   GLUCOSE 121* 121* 117*  --   BUN 35* 24* 21*  --   CREATININE 1.40* 1.49* 1.29*  --   CALCIUM  --  9.5 9.1  --   MG  --   --   --  2.0   Liver Function Tests:  Recent Labs Lab 01/04/15 1800 01/05/15 0645  AST 35 35  ALT 51 43  ALKPHOS 131* 119  BILITOT 1.2 1.2  PROT 7.4 7.1  ALBUMIN 3.8 3.4*   Darrell results for input(s): LIPASE, AMYLASE in the last 168 hours. Darrell results for input(s): AMMONIA  in the last 168 hours. CBC:  Recent Labs Lab 01/04/15 1752 01/04/15 1800  WBC  --  7.0  NEUTROABS  --  4.4  HGB 19.0* 17.5*  HCT 56.0* 50.8  MCV  --  88.5  PLT  --  308   Cardiac Enzymes: Darrell results for input(s): CKTOTAL, CKMB, CKMBINDEX, TROPONINI in the last 168 hours. BNP (last 3 results)  Recent Labs  01/06/15 1233  BNP 147.9*    ProBNP (last 3 results) Darrell results for input(s): PROBNP in the last 8760 hours.  CBG:  Recent Labs Lab 01/04/15 1831  GLUCAP 119*    Darrell results found for this or any previous visit (from the past 240 hour(s)).   Studies: Darrell results found.  Scheduled Meds: . aspirin EC  325 mg Oral Daily  . atorvastatin  80 mg Oral q1800  . carvedilol  3.125 mg Oral BID WC  . clopidogrel  75 mg Oral Daily  . enoxaparin (LOVENOX) injection  40 mg Subcutaneous Q24H  . fluticasone  2 spray Each Nare Daily  . levETIRAcetam  500 mg Oral BID  . lisinopril  20 mg Oral Daily   Continuous Infusions:    Time spent: 15 minutes  [name]  [facility] [email or web address], password [facility] 01/08/2015,  12:01 PM

## 2015-01-08 NOTE — Progress Notes (Signed)
STROKE TEAM PROGRESS NOTE   HISTORY Tawfiq Favila is a 54 y.o. male with a history of hypertension, hyperlipidemia and coronary artery disease with myocardial infarction, presenting with acute onset of aphasia as well as right hemiparesis. Patient had difficulty formulating what he wanted to say as well as noticeable errors in speech output. Symptoms lasted for a few minutes then resolved. He has no previous history of stroke nor TIA. He has not been on and a platelet therapy. CT scan of his head showed no acute intracranial abnormality. NIH score at the time of this evaluation was 0.  LSN: 4:00 PM on 01/04/2015 tPA Given: No: Deficits resolved mRankin:   SUBJECTIVE (INTERVAL HISTORY) Wife is at bedside with the patient. The patient reports having 4 spells before coming to the hospital. He subsequently has had about 3 prior to coming to the hospital. The initial event is described as tonic flexion of the right hand/wrist associated with numbness. The patient also did have diaphoresis with the initial event and was confused afterwards. These subsequent events tend to be confusion and aphasia as a prominent symptoms.   OBJECTIVE Temp:  [97.7 F (36.5 C)-98.1 F (36.7 C)] 97.7 F (36.5 C) (08/14 0940) Pulse Rate:  [66-85] 69 (08/14 0940) Cardiac Rhythm:  [-] Normal sinus rhythm (08/14 0300) Resp:  [18-20] 18 (08/14 0940) BP: (106-137)/(73-99) 126/99 mmHg (08/14 0940) SpO2:  [96 %-100 %] 100 % (08/14 0940)   Recent Labs Lab 01/04/15 1831  GLUCAP 119*    Recent Labs Lab 01/04/15 1752 01/04/15 1800 01/05/15 0645 01/06/15 1235  NA 137 136 136  --   K 4.4 4.2 4.3  --   CL 99* 98* 100*  --   CO2  --  26 25  --   GLUCOSE 121* 121* 117*  --   BUN 35* 24* 21*  --   CREATININE 1.40* 1.49* 1.29*  --   CALCIUM  --  9.5 9.1  --   MG  --   --   --  2.0    Recent Labs Lab 01/04/15 1800 01/05/15 0645  AST 35 35  ALT 51 43  ALKPHOS 131* 119  BILITOT 1.2 1.2  PROT 7.4 7.1   ALBUMIN 3.8 3.4*    Recent Labs Lab 01/04/15 1752 01/04/15 1800  WBC  --  7.0  NEUTROABS  --  4.4  HGB 19.0* 17.5*  HCT 56.0* 50.8  MCV  --  88.5  PLT  --  308   No results for input(s): CKTOTAL, CKMB, CKMBINDEX, TROPONINI in the last 168 hours. No results for input(s): LABPROT, INR in the last 72 hours. No results for input(s): COLORURINE, LABSPEC, PHURINE, GLUCOSEU, HGBUR, BILIRUBINUR, KETONESUR, PROTEINUR, UROBILINOGEN, NITRITE, LEUKOCYTESUR in the last 72 hours.  Invalid input(s): APPERANCEUR     Component Value Date/Time   CHOL 214* 01/05/2015 0645   TRIG 129 01/05/2015 0645   HDL 24* 01/05/2015 0645   CHOLHDL 8.9 01/05/2015 0645   VLDL 26 01/05/2015 0645   LDLCALC 164* 01/05/2015 0645   Lab Results  Component Value Date   HGBA1C 7.0* 01/05/2015      Component Value Date/Time   LABOPIA NONE DETECTED 01/05/2015 1435   COCAINSCRNUR NONE DETECTED 01/05/2015 1435   LABBENZ NONE DETECTED 01/05/2015 1435   AMPHETMU NONE DETECTED 01/05/2015 1435   THCU NONE DETECTED 01/05/2015 1435   LABBARB NONE DETECTED 01/05/2015 1435    No results for input(s): ETH in the last 168 hours.   IMAGING  Ct  Angio Head and Neck W/cm &/or Wo Cm 01/04/2015    1. White matter disease without evidence for acute cortical infarct.  2. Mild attenuation of MCA branch vessels, right greater than left.  3. No significant proximal stenosis, aneurysm, or branch vessel occlusion.  4. Mild atherosclerotic changes within the carotid bifurcations bilaterally without significant stenosis.  5. Degenerative changes in the cervical spine.     Ct Head Wo Contrast 01/04/2015    1. Normal CT appearance the brain.  2. Opacification of the superior aspect of the left frontal sinus with surrounding reactive bone change. This appears to be a chronic process. Recommend correlation with the patient's symptoms.    Mr Brain Wo Contrast 01/04/2015    1. No acute intracranial abnormality.  2. Age advanced  atrophy and white matter disease.  3. 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  4. Frontal sinus disease.     2-D echocardiogram 01/06/2015 EF 20-25% with diffuse hypokinesis and akinesis. No definite cardiac source of emboli identified.    PHYSICAL EXAM Pleasant middle aged mildly obese Caucasian male not in distress. . Afebrile. Head is nontraumatic. Neck is supple without bruit.    Cardiac exam no murmur or gallop. Lungs are clear to auscultation. Distal pulses are well felt.  Neurological Exam :  Awake alert oriented. No aphasia, dysarthria or apraxia. Follows commands well. Intact attention, registration and recall. Pupils equal reactive. Fundi were not visualized. Vision acuity and fields seem adequate. Face is symmetric without weakness. Tongue is midline. Good cough and gag. Motor system exam revealed no upper or lower extremity drift. Symmetric and equal strength in all 4 extremities. Deep tendon reflexes are 1+ symmetric. Plantars are downgoing. Sensation is intact. Gait was not tested. Deep tendon reflexes are 1+ symmetric. Plantars are downgoing. Sensation appears intact bilaterally. Gait was not tested.  The brain MRI is reviewed in person. There is minimal deep white matter tiny white matter lesions. No acute infarcts are seen on diffusion imaging. Flare imaging shows a small cortical infarct involving the left temporal region.   ASSESSMENT/PLAN Mr. Jayce Kainz is a 54 y.o. male with history of hypertension, previous CABG with postop atrial fibrillation, CHF, hyperlipidemia, and previous MI,  presenting with transient episodes of speech difficulties, confusion, and bilateral upper extremity weakness.  He did not receive IV t-PA due to resolution of deficits.  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 (( Semiology of the events seems most consistent with complex partial seizures and less likely  ischemic given the negative imaging for acute infarct or significant intracranial occlusive disease)). The patient will therefore be started on Keppra.   Resultant  resolution of deficits  MRI  no acute intracranial abnormality. Remote left temporoparietal infarct.  MRA  not performed  CTA of head and neck - no high-grade stenosis.  Carotid Doppler - see CTA of neck.  2D Echo - EF 20-25% with diffuse hypokinesis. No definite clot but suboptimal quality study  LDL - 164  EEG - normal  HgbA1c 7.0  Telemetry - NSR so far.  Admission ECG - ST rate 101  Lovenox for VTE prophylaxis Diet Heart Room service appropriate?: Yes; Fluid consistency:: Thin  no antithrombotic prior to admission, now on clopidogrel 75 mg orally every day  Patient counseled to be compliant with his antithrombotic medications  Ongoing aggressive stroke risk factor management  Therapy recommendations: No follow-up physical therapy  Disposition: Pending  Hypertension  Home meds: Lisinopril and hydrochlorothiazide  Stable  Patient counseled to be compliant with his blood pressure medications  Hyperlipidemia  Home meds:  No lipid lowering medications prior to admission  LDL 164, goal < 70  Add Lipitor 80 mg daily  Continue statin at discharge  Diabetes  HgbA1c 7.0, goal < 7.0  No history of diabetes mellitus  Other Stroke Risk Factors  Obesity, Body mass index is 36.34 kg/(m^2).   Hx of stroke by MRI  Family hx stroke (mother)  Coronary artery disease   Other Active Problems  Mild renal insufficiency (recently placed on ACE inhibitor and hydrochlorothiazide)  Transient episode of aphasia while on 4 N.   Other Pertinent History  Hospital Day#4.  NEUROLOGY CONSULT ATTENDING NOTE Patient was seen and examined by me personally. I reviewed notes, independently viewed imaging studies, participated in medical decision making and plan of care. I have made additions or  clarifications directly to the above note.  Documentation accurately reflects findings. The laboratory and radiographic studies were personally reviewed by me.  ROS completed by me personally and there were no pertinent positives  Assessment and plan completed by me personally and fully documented above.  Plans include:   Neuro:  Thousand milligrams of Keppra loading dose followed by 500 mg twice a day orally.  Discussed the [place] law for driving if seizure diagnosis given  Cardiac:    Continuous cardiopulmonary monitoring;   Continue stroke workup  Appreciate cardiology assistance. Cardiac catheterization planned for Monday. Beta blocker added.  Plan TEE to neurology.  No documented atrial fib on telemetry however, PCP once mentioned he might have a-fib; patient should consider outpatient monitor when he returns to [place].  Condition is improved  SIGNED BY: Dr. Sula Soda    To contact Stroke Continuity provider, please refer to [email or web address]. After hours, contact General Neurology

## 2015-01-09 ENCOUNTER — Encounter (HOSPITAL_COMMUNITY): Payer: Self-pay | Admitting: Cardiovascular Disease

## 2015-01-09 ENCOUNTER — Encounter (HOSPITAL_COMMUNITY)
Admission: EM | Disposition: A | Discharge status: Home / Self Care | Payer: Managed Care, Other (non HMO) | Source: Home / Self Care | Attending: Emergency Medicine

## 2015-01-09 DIAGNOSIS — I420 Dilated cardiomyopathy: Secondary | ICD-10-CM | POA: Diagnosis not present

## 2015-01-09 DIAGNOSIS — R931 Abnormal findings on diagnostic imaging of heart and coronary circulation: Secondary | ICD-10-CM | POA: Insufficient documentation

## 2015-01-09 DIAGNOSIS — R4701 Aphasia: Secondary | ICD-10-CM | POA: Diagnosis not present

## 2015-01-09 DIAGNOSIS — G459 Transient cerebral ischemic attack, unspecified: Secondary | ICD-10-CM | POA: Diagnosis not present

## 2015-01-09 DIAGNOSIS — E785 Hyperlipidemia, unspecified: Secondary | ICD-10-CM

## 2015-01-09 DIAGNOSIS — I2510 Atherosclerotic heart disease of native coronary artery without angina pectoris: Secondary | ICD-10-CM | POA: Diagnosis not present

## 2015-01-09 DIAGNOSIS — G451 Carotid artery syndrome (hemispheric): Secondary | ICD-10-CM | POA: Diagnosis not present

## 2015-01-09 HISTORY — PX: CARDIAC CATHETERIZATION: SHX172

## 2015-01-09 LAB — BASIC METABOLIC PANEL
Anion gap: 8 (ref 5–15)
BUN: 19 mg/dL (ref 6–20)
CALCIUM: 8.8 mg/dL — AB (ref 8.9–10.3)
CHLORIDE: 105 mmol/L (ref 101–111)
CO2: 27 mmol/L (ref 22–32)
CREATININE: 1.18 mg/dL (ref 0.61–1.24)
GFR calc Af Amer: >60 mL/min (ref >60)
GFR calc non Af Amer: >60 mL/min (ref >60)
Glucose, Bld: 103 mg/dL — ABNORMAL HIGH (ref 65–99)
Potassium: 4.1 mmol/L (ref 3.5–5.1)
SODIUM: 140 mmol/L (ref 135–145)

## 2015-01-09 SURGERY — LEFT HEART CATH AND CORS/GRAFTS ANGIOGRAPHY
Anesthesia: LOCAL

## 2015-01-09 MED ORDER — IOHEXOL 350 MG/ML SOLN
INTRAVENOUS | Status: DC | PRN
  Administered 2015-01-09: 125 mL via INTRA_ARTERIAL

## 2015-01-09 MED ORDER — HEPARIN (PORCINE) IN NACL 2-0.9 UNIT/ML-% IJ SOLN
INTRAMUSCULAR | Status: AC
  Filled 2015-01-09: qty 1500

## 2015-01-09 MED ORDER — ASPIRIN 325 MG PO TBEC: 325.0000 mg | DELAYED_RELEASE_TABLET | Freq: Every day | ORAL | Status: AC

## 2015-01-09 MED ORDER — LIDOCAINE HCL (PF) 1 % IJ SOLN
INTRAMUSCULAR | Status: DC | PRN
  Administered 2015-01-09: 12:00:00

## 2015-01-09 MED ORDER — FENTANYL CITRATE (PF) 100 MCG/2ML IJ SOLN
INTRAMUSCULAR | Status: AC
  Filled 2015-01-09: qty 4

## 2015-01-09 MED ORDER — SODIUM CHLORIDE 0.9 % IJ SOLN
3.0000 mL | Freq: Two times a day (BID) | INTRAMUSCULAR | Status: DC
  Administered 2015-01-09 – 2015-01-10 (×2): 3 mL via INTRAVENOUS

## 2015-01-09 MED ORDER — SODIUM CHLORIDE 0.9 % IV SOLN: INTRAVENOUS | Status: AC

## 2015-01-09 MED ORDER — SODIUM CHLORIDE 0.9 % IV SOLN: 250.0000 mL | INTRAVENOUS | Status: DC | PRN

## 2015-01-09 MED ORDER — HEPARIN SODIUM (PORCINE) 1000 UNIT/ML IJ SOLN
INTRAMUSCULAR | Status: AC
  Filled 2015-01-09: qty 1

## 2015-01-09 MED ORDER — ATORVASTATIN CALCIUM 80 MG PO TABS: 80.0000 mg | ORAL_TABLET | Freq: Every day | ORAL | Status: DC

## 2015-01-09 MED ORDER — SODIUM CHLORIDE 0.9 % IV SOLN: INTRAVENOUS | Status: DC

## 2015-01-09 MED ORDER — HEPARIN SODIUM (PORCINE) 1000 UNIT/ML IJ SOLN
INTRAMUSCULAR | Status: DC | PRN
  Administered 2015-01-09: 6000 [IU] via INTRAVENOUS

## 2015-01-09 MED ORDER — VERAPAMIL HCL 2.5 MG/ML IV SOLN
INTRAVENOUS | Status: AC
  Filled 2015-01-09: qty 2

## 2015-01-09 MED ORDER — SODIUM CHLORIDE 0.9 % IJ SOLN: 3.0000 mL | INTRAMUSCULAR | Status: DC | PRN

## 2015-01-09 MED ORDER — LIDOCAINE HCL (PF) 1 % IJ SOLN
INTRAMUSCULAR | Status: AC
  Filled 2015-01-09: qty 30

## 2015-01-09 MED ORDER — VERAPAMIL HCL 2.5 MG/ML IV SOLN
INTRAVENOUS | Status: DC | PRN
  Administered 2015-01-09: 11:00:00 via INTRA_ARTERIAL

## 2015-01-09 MED ORDER — CARVEDILOL 3.125 MG PO TABS: 3.1250 mg | ORAL_TABLET | Freq: Two times a day (BID) | ORAL | Status: DC

## 2015-01-09 MED ORDER — MIDAZOLAM HCL 2 MG/2ML IJ SOLN
INTRAMUSCULAR | Status: DC | PRN
  Administered 2015-01-09: 2 mg via INTRAVENOUS

## 2015-01-09 MED ORDER — FENTANYL CITRATE (PF) 100 MCG/2ML IJ SOLN
INTRAMUSCULAR | Status: DC | PRN
  Administered 2015-01-09: 25 ug via INTRAVENOUS

## 2015-01-09 MED ORDER — MIDAZOLAM HCL 2 MG/2ML IJ SOLN
INTRAMUSCULAR | Status: AC
  Filled 2015-01-09: qty 4

## 2015-01-09 SURGICAL SUPPLY — 13 items
CATH EXPO 5F MPA-1 (CATHETERS) ×2 IMPLANT
CATH INFINITI 5 FR AR1 MOD (CATHETERS) ×2 IMPLANT
CATH INFINITI 5 FR JL3.5 (CATHETERS) ×2 IMPLANT
CATH INFINITI 5 FR RCB (CATHETERS) ×2 IMPLANT
CATH INFINITI MULTIPACK ST 5F (CATHETERS) ×2 IMPLANT
DEVICE RAD COMP TR BAND LRG (VASCULAR PRODUCTS) ×2 IMPLANT
GLIDESHEATH SLEND SS 6F .021 (SHEATH) ×2 IMPLANT
KIT HEART LEFT (KITS) ×2 IMPLANT
PACK CARDIAC CATHETERIZATION (CUSTOM PROCEDURE TRAY) ×2 IMPLANT
SYR MEDRAD MARK V 150ML (SYRINGE) ×2 IMPLANT
TRANSDUCER W/STOPCOCK (MISCELLANEOUS) ×2 IMPLANT
TUBING CIL FLEX 10 FLL-RA (TUBING) ×2 IMPLANT
WIRE SAFE-T 1.5MM-J .035X260CM (WIRE) ×2 IMPLANT

## 2015-01-09 NOTE — Progress Notes (Signed)
Patient is transferred to cardiac cath lab at this time. Alert and in stable condition.

## 2015-01-09 NOTE — Progress Notes (Signed)
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 Denies chest pain, sob or palpitation.   CURRENT MEDS . aspirin EC  325 mg Oral Daily  . atorvastatin  80 mg Oral q1800  . carvedilol  3.125 mg Oral BID WC  . clopidogrel  75 mg Oral Daily  . enoxaparin (LOVENOX) injection  40 mg Subcutaneous Q24H  . fluticasone  2 spray Each Nare Daily  . levETIRAcetam  500 mg Oral BID  . lisinopril  20 mg Oral Daily    OBJECTIVE  Filed Vitals:   01/09/15 0142 01/09/15 0509 01/09/15 0516 01/09/15 0952  BP: 134/80 120/69 105/47 120/80  Pulse: 71 68 55 68  Temp: 97.9 F (36.6 C) 97.8 F (36.6 C) 99.6 F (37.6 C) 97.7 F (36.5 C)  TempSrc: Oral Oral Oral Oral  Resp: 18 18 18 18   Height:      Weight:      SpO2: 98% 99% 97% 98%   No intake or output data in the 24 hours ending 01/09/15 1021 Filed Weights   01/04/15 1800 01/04/15 2300  Weight: 259 lb 11.2 oz (117.8 kg) 253 lb 4.8 oz (114.896 kg)    PHYSICAL EXAM  General: Pleasant, NAD. Neuro: Alert and oriented X 3. Moves all extremities spontaneously. Psych: Normal affect. HEENT:  Normal  Neck: Supple without bruits or JVD. Lungs:  Resp regular and unlabored, CTA. Heart: RRR no s3, s4, or murmurs. Abdomen: Soft, non-tender, non-distended, BS + x 4.  Extremities: No clubbing, cyanosis or edema. DP/PT/Radials 2+ and equal bilaterally.  Accessory Clinical Findings  CBC No results for input(s): WBC, NEUTROABS, HGB, HCT, MCV, PLT in the last 72 hours. Basic Metabolic Panel  Recent Labs  01/06/15 1235 01/09/15 0339  NA  --  140  K  --  4.1  CL  --  105  CO2  --  27  GLUCOSE  --  103*  BUN  --  19  CREATININE  --  1.18  CALCIUM  --  8.8*    MG 2.0  --      Recent Labs  01/06/15 1233  TSH 2.236    TELE  NSR at rate of 50s.   Radiology/Studies  Ct Angio Head W/cm &/or Wo Cm  01/04/2015   CLINICAL DATA:  Episode lasting 45 seconds with acute onset weakness in the right upper and lower extremities. Persistent intermittent aphasia  EXAM: CT ANGIOGRAPHY HEAD AND NECK  TECHNIQUE: Multidetector CT imaging of the head and neck was performed using the standard protocol during bolus administration of intravenous contrast. Multiplanar CT image reconstructions and MIPs were obtained to evaluate the vascular anatomy. Carotid stenosis measurements (when applicable) are obtained utilizing NASCET criteria, using the distal internal carotid diameter as the denominator.  CONTRAST:  OMNIPAQUE IOHEXOL 350 MG/ML SOLN  COMPARISON:  CT head without contrast from the same day.  FINDINGS: CT HEAD  Brain: The source images demonstrate no evidence for acute or subacute infarction. Basal ganglia are intact. The insular ribbon is intact. No acute cortical infarct is present.  Calvarium and skull base: Within normal limits.  Paranasal sinuses: Mild mucosal thickening is present in the frontal sinuses bilaterally. The paranasal sinuses and mastoid air cells  are otherwise clear.  Orbits: Negative  CTA NECK  Aortic arch: A common origin of the left common carotid artery and innominate artery is noted. The study is mildly degraded by patient motion.  Right carotid system: The right common carotid artery is within normal limits. Atherosclerotic calcifications are present at the origin of the right internal carotid artery without significant stenosis. The cervical right ICA is normal.  Left carotid system: The left common carotid artery is within normal limits. Atherosclerotic changes are present at the carotid bifurcation without a significant stenosis relative to the more distal vessel. The more distal cervical ICA is normal.  Vertebral arteries:The vertebral  arteries originate from of the subclavian arteries bilaterally. Mild to moderate narrowing is present at proximal left vertebral artery. No focal stenosis or vascular injury is evident within the vertebral arteries.  Skeleton: Mild endplate degenerative changes are present from C3-4 through C5-6. Uncovertebral spurring is worse left than right at C3-4 and C5-6. No focal lytic or blastic lesions are present.  Other neck: The soft tissues of the neck demonstrate calcifications in the palatine tonsils bilaterally compatible with the history of infection. Lymph nodes are present in the parotid glands bilaterally. Reactive size level 2 and level 3 lymph nodes are present bilaterally. No significant cervical adenopathy is present.  The lung apices are clear  CTA HEAD  Anterior circulation: Minimal atherosclerotic changes are present within the cavernous internal carotid arteries bilaterally without significant stenosis through the ICA termini. The A1 and M1 segments are normal. The MCA bifurcations are intact. There is some attenuation of distal MCA branch vessels, worse on the right.  Posterior circulation: The vertebral arteries are codominant. PICA origins are visualized and normal bilaterally. A fetal type left posterior cerebral artery is present. The right posterior cerebral artery originates from the basilar tip.  Venous sinuses: The dural sinuses are patent. The straight sinus and deep cerebral veins are intact. Cortical veins are within normal limits.  Anatomic variants: Fetal type left posterior cerebral artery.  Delayed phase: The postcontrast images demonstrate no pathologic enhancement.  IMPRESSION: 1. White matter disease without evidence for acute cortical infarct. 2. Mild attenuation of MCA branch vessels, right greater than left. 3. No significant proximal stenosis, aneurysm, or branch vessel occlusion. 4. Mild atherosclerotic changes within the carotid bifurcations bilaterally without significant  stenosis. 5. Degenerative changes in the cervical spine.   Electronically Signed   By: Marin Roberts M.D.   On: 01/04/2015 21:33   Dg Chest 2 View  01/05/2015   CLINICAL DATA:  Recent TIAs  EXAM: CHEST - 2 VIEW  COMPARISON:  None.  FINDINGS: Cardiac shadow is within normal limits. Postsurgical changes are seen. The lungs are well aerated bilaterally. No bony abnormality is seen.  IMPRESSION: No active disease.   Electronically Signed   By: Alcide Clever M.D.   On: 01/05/2015 09:15   Ct Head Wo Contrast  01/04/2015   CLINICAL DATA:  Right-sided weakness and aphasia.  Code stroke.  EXAM: CT HEAD WITHOUT CONTRAST  TECHNIQUE: Contiguous axial images were obtained from the base of the skull through the vertex without intravenous contrast.  COMPARISON:  None.  FINDINGS: No acute cortical infarct, hemorrhage, or mass lesion is present. The ventricles are of normal size. No significant extra-axial fluid collection is evident. Focal opacity in the superior left frontal lobe appears separated by septation. There is surrounding reactive bone change. Paranasal sinuses are otherwise clear. The mastoid air cells are clear.  ASPECTS score =  10/10  [place] Stroke Program Early CT Score  Normal score = 10  IMPRESSION: 1. Normal CT appearance the brain. 2. Opacification of the superior aspect of the left frontal sinus with surrounding reactive bone change. This appears to be a chronic process. Recommend correlation with the patient's symptoms.  These results were called by telephone at the time of interpretation on 01/04/2015 at 6:17 pm to Dr. Noel Christmas , who verbally acknowledged these results.   Electronically Signed   By: Marin Roberts M.D.   On: 01/04/2015 18:29   Ct Angio Neck W/cm &/or Wo/cm  01/04/2015   CLINICAL DATA:  Episode lasting 45 seconds with acute onset weakness in the right upper and lower extremities. Persistent intermittent aphasia  EXAM: CT ANGIOGRAPHY HEAD AND NECK  TECHNIQUE:  Multidetector CT imaging of the head and neck was performed using the standard protocol during bolus administration of intravenous contrast. Multiplanar CT image reconstructions and MIPs were obtained to evaluate the vascular anatomy. Carotid stenosis measurements (when applicable) are obtained utilizing NASCET criteria, using the distal internal carotid diameter as the denominator.  CONTRAST:  OMNIPAQUE IOHEXOL 350 MG/ML SOLN  COMPARISON:  CT head without contrast from the same day.  FINDINGS: CT HEAD  Brain: The source images demonstrate no evidence for acute or subacute infarction. Basal ganglia are intact. The insular ribbon is intact. No acute cortical infarct is present.  Calvarium and skull base: Within normal limits.  Paranasal sinuses: Mild mucosal thickening is present in the frontal sinuses bilaterally. The paranasal sinuses and mastoid air cells are otherwise clear.  Orbits: Negative  CTA NECK  Aortic arch: A common origin of the left common carotid artery and innominate artery is noted. The study is mildly degraded by patient motion.  Right carotid system: The right common carotid artery is within normal limits. Atherosclerotic calcifications are present at the origin of the right internal carotid artery without significant stenosis. The cervical right ICA is normal.  Left carotid system: The left common carotid artery is within normal limits. Atherosclerotic changes are present at the carotid bifurcation without a significant stenosis relative to the more distal vessel. The more distal cervical ICA is normal.  Vertebral arteries:The vertebral arteries originate from of the subclavian arteries bilaterally. Mild to moderate narrowing is present at proximal left vertebral artery. No focal stenosis or vascular injury is evident within the vertebral arteries.  Skeleton: Mild endplate degenerative changes are present from C3-4 through C5-6. Uncovertebral spurring is worse left than right at C3-4 and  C5-6. No focal lytic or blastic lesions are present.  Other neck: The soft tissues of the neck demonstrate calcifications in the palatine tonsils bilaterally compatible with the history of infection. Lymph nodes are present in the parotid glands bilaterally. Reactive size level 2 and level 3 lymph nodes are present bilaterally. No significant cervical adenopathy is present.  The lung apices are clear  CTA HEAD  Anterior circulation: Minimal atherosclerotic changes are present within the cavernous internal carotid arteries bilaterally without significant stenosis through the ICA termini. The A1 and M1 segments are normal. The MCA bifurcations are intact. There is some attenuation of distal MCA branch vessels, worse on the right.  Posterior circulation: The vertebral arteries are codominant. PICA origins are visualized and normal bilaterally. A fetal type left posterior cerebral artery is present. The right posterior cerebral artery originates from the basilar tip.  Venous sinuses: The dural sinuses are patent. The straight sinus and deep cerebral veins are intact. Cortical veins  are within normal limits.  Anatomic variants: Fetal type left posterior cerebral artery.  Delayed phase: The postcontrast images demonstrate no pathologic enhancement.  IMPRESSION: 1. White matter disease without evidence for acute cortical infarct. 2. Mild attenuation of MCA branch vessels, right greater than left. 3. No significant proximal stenosis, aneurysm, or branch vessel occlusion. 4. Mild atherosclerotic changes within the carotid bifurcations bilaterally without significant stenosis. 5. Degenerative changes in the cervical spine.   Electronically Signed   By: Marin Roberts M.D.   On: 01/04/2015 21:33   Mr Brain Wo Contrast  01/04/2015   CLINICAL DATA:  Acute onset of intermittent aphasia. Episode of right hemiparesis.  EXAM: MRI HEAD WITHOUT CONTRAST  TECHNIQUE: Multiplanar, multiecho pulse sequences of the brain and  surrounding structures were obtained without intravenous contrast.  COMPARISON:  CTA head and neck from the same day.  FINDINGS: The diffusion-weighted images demonstrate no evidence for acute or subacute infarction. Mild atrophy and white matter change is slightly advanced for age.  The ventricles are of normal size. A remote left temporal parietal lobe infarct is noted. No significant extraaxial fluid collection is present.  Flow is present in the major intracranial arteries. The globes and orbits are intact. Mild mucosal thickening is present in the anterior ethmoid air cells and frontal sinuses. A sequestered area in the superior left frontal sinus is opacified with osseous changes overlying this area, likely reactive.  The skullbase is within normal limits. Midline structures are unremarkable.  IMPRESSION: 1. No acute intracranial abnormality. 2. Age advanced atrophy and white matter disease. 3. 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 4. Frontal sinus disease.   Electronically Signed   By: Marin Roberts M.D.   On: 01/04/2015 21:36    ASSESSMENT AND PLAN   1. LV Systolic Dysfunction: -  Patient notes new diagnosis of CHF 3 weeks ago by PCP. 2D echo here shows severe LV dysfunction with EF at 20-25%. He is euvolemic on physical exam w/o dyspnea. - Continue ACE, BB and statin.  Low sodium diet.  - Cath today.  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 No arrhythmias on tele. Can f/u with his cardiologist at [facility].   2. CAD: -  h/o SCD and CABG x 3 two years ago. Now with new cardiomyopathy with EF at 20-25%.  - Cath today. Continue ASA, Plavix and statin for now.   3. TIAs?  - per neurology  - Possible recurrent left brain TIAs in setting of remote L cortical infarct and low EF. Seizure less likely. - MRI - no acute intracranial abnormality. Remote left temporoparietal infarct. -  Neuro recommended TEE. Will schedule for TEE tomorrow.    After careful review of history and examination, the risks and benefits of transesophageal echocardiogram have been explained including risks of esophageal damage, perforation (1:10,000 risk), bleeding, pharyngeal hematoma as well as other potential complications associated with conscious sedation including aspiration, arrhythmia, respiratory failure and death. Alternatives to treatment were discussed, questions were answered. Patient is willing to proceed.  NPO after midnight. Meds with sips.   Marney Doctor 01/09/2015 10:32 AM  Pager [phone]  I have examined the patient and reviewed assessment and plan and discussed with patient.  Agree with above as stated.  Per neuro, TEE required.  I spoke to the patient and family.  THey prefer to go home to [place] and have further w/u.  Left wrist is stabel.  No bleeding.  No further cardiac testing needed.  Medical therapy of cardiomyopathy.  OK to discharge from a cardiac standpoint after TR band deflation protocol.  F/u with cardiologist in [place] in a few days as already scheduled.  Darrell Leisinger S.

## 2015-01-09 NOTE — Progress Notes (Signed)
[facility] PROGRESS NOTE  Darrell Knapp ZOX:[id number] DOB: 07-Apr-1961 DOA: 01/04/2015 PCP: No primary care provider on file.  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   LDL 164. Started statin. On ASA. plavix Echo shows ejection fraction of 20% but no source of embolus.  EEG negative.  hgb a1c 7. Neurology has started Keppra in case complex partial seizures. Pt would like to proceed with TEE tomorrow. Have notified cardiology. Needs event monitor v. Loop recorder arranged in [place] after discharge.  Continue keppra and needs f/u with neurology in [place]. May be able to stop AEDs Active Problems:  ischemic cardiomyopathy: Already on ACE inhibitor. Beta blocker started. Cath   Prox LAD lesion, 100% stenosed.  LIMA graft to mid LAD was injected, is normal in caliber, and is anatomically normal.  Prox RCA lesion, 50% stenosed.  Mid RCA lesion, 90% stenosed.  Prox Cx lesion, 20% stenosed.  SVG to PLA patent  SVG to PDA is occluded  1. 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  2. Occluded proximal LAD. Patent LIMA to mid LAD 3. Patent SVG to right Posterolateral artery 4. Occluded SVG to PDA. The PDA fills from native antegrade flow and from the patent graft to the PLA 5. Patent native Circumflex with minimal stenosis.  6. Cardiomyopathy  Recommendations: Medical management of CAD and cardiomyopathy. Should be able to discharge after bedrest if ok with Neuro. Follow up with Cardiologist in [place] next week.  HLD. Now on statin   Hypertension   CAD (coronary artery disease)   Renal insufficiency: baseline unknown   Aphasia   Sinusitis, chronic: added flonase. Patient reports improvement in symptoms.  HPI/Subjective: No new complaints. No recurrence of aphasia.  Objective: Filed Vitals:   01/09/15 2016  BP: 126/79  Pulse: 71  Temp: 97.4 F (36.3 C)  Resp: 16     Intake/Output Summary (Last 24 hours) at 01/09/15 2314 Last data filed at 01/09/15 1900  Gross per 24 hour  Intake 388.33 ml  Output    520 ml  Net -131.67 ml   Filed Weights   01/04/15 1800 01/04/15 2300  Weight: 117.8 kg (259 lb 11.2 oz) 114.896 kg (253 lb 4.8 oz)    Exam:   General:  Alert, oriented. Noted walking in halls  Cardiovascular: RRR without MGR  Respiratory: CTA without WRR  Abdomen: S, NT, ND  Ext: no CCE  Neuro: Speech clear and fluent. Motor strength intact.  Basic Metabolic Panel:  Recent Labs Lab 01/04/15 1752 01/04/15 1800 01/05/15 0645 01/06/15 1235 01/09/15 0339  NA 137 136 136  --  140  K 4.4 4.2 4.3  --  4.1  CL 99* 98* 100*  --  105  CO2  --  26 25  --  27  GLUCOSE 121* 121* 117*  --  103*  BUN 35* 24* 21*  --  19  CREATININE 1.40* 1.49* 1.29*  --  1.18  CALCIUM  --  9.5 9.1  --  8.8*  MG  --   --   --  2.0  --    Liver Function Tests:  Recent Labs Lab 01/04/15 1800 01/05/15 0645  AST 35 35  ALT 51 43  ALKPHOS 131* 119  BILITOT 1.2 1.2  PROT 7.4 7.1  ALBUMIN 3.8 3.4*   No results for input(s): LIPASE, AMYLASE in the last 168 hours. No results for input(s): AMMONIA in the last  168 hours. CBC:  Recent Labs Lab 01/04/15 1752 01/04/15 1800  WBC  --  7.0  NEUTROABS  --  4.4  HGB 19.0* 17.5*  HCT 56.0* 50.8  MCV  --  88.5  PLT  --  308   Cardiac Enzymes: No results for input(s): CKTOTAL, CKMB, CKMBINDEX, TROPONINI in the last 168 hours. BNP (last 3 results)  Recent Labs  01/06/15 1233  BNP 147.9*    ProBNP (last 3 results) No results for input(s): PROBNP in the last 8760 hours.  CBG:  Recent Labs Lab 01/04/15 1831  GLUCAP 119*    No results found for this or any previous visit (from the past 240 hour(s)).   Studies: No results found.  Scheduled Meds: . aspirin EC  325 mg Oral Daily  . atorvastatin  80 mg Oral q1800  . carvedilol  3.125 mg Oral BID WC  . clopidogrel  75 mg Oral Daily  .  fluticasone  2 spray Each Nare Daily  . levETIRAcetam  500 mg Oral BID  . lisinopril  20 mg Oral Daily  . sodium chloride  3 mL Intravenous Q12H   Continuous Infusions: . sodium chloride      Time spent: 25 minutes  Darrell Knapp  [facility] [email or web address], password [facility] 01/09/2015, 11:14 PM

## 2015-01-09 NOTE — Interval H&P Note (Signed)
History and Physical Interval Note:  01/09/2015 10:52 AM  Darrell Knapp  has presented today for cardiac cath with the diagnosis of CAD s/p CABG, cardiomyopathy. The various methods of treatment have been discussed with the patient and family. After consideration of risks, benefits and other options for treatment, the patient has consented to  Procedure(s): Left Heart Cath and Cors/Grafts Angiography (N/A) as a surgical intervention .  The patient's history has been reviewed, patient examined, no change in status, stable for surgery.  I have reviewed the patient's chart and labs.  Questions were answered to the patient's satisfaction.    Cath Lab Visit (complete for each Cath Lab visit)  Clinical Evaluation Leading to the Procedure:   ACS: No.  Non-ACS:    Anginal Classification: CCS II  Anti-ischemic medical therapy: No Therapy  Non-Invasive Test Results: No non-invasive testing performed  Prior CABG: Previous CABG         MCALHANY,CHRISTOPHER

## 2015-01-09 NOTE — Progress Notes (Signed)
STROKE TEAM PROGRESS NOTE   HISTORY Darrell Knapp is a 54 y.o. male with a history of hypertension, hyperlipidemia and coronary artery disease with myocardial infarction, presenting with acute onset of aphasia as well as right hemiparesis. Patient had difficulty formulating what he wanted to say as well as noticeable errors in speech output. Symptoms lasted for a few minutes then resolved. He has no previous history of stroke nor TIA. He has not been on and a platelet therapy. CT scan of his head showed no acute intracranial abnormality. NIH score at the time of this evaluation was 0.  LSN: 4:00 PM on 01/04/2015 tPA Given: No: Deficits resolved mRankin:   SUBJECTIVE (INTERVAL HISTORY) No family at  Bedside. Pt anticipates cath today. Reports decreased endurance that started 1.5 mo ago. Shared RUE paresis followed by shaking that lasted about 15 mins with episodes of words finding difficulties. patient reports feeling palpitations, heart racing, unsustained.   OBJECTIVE Temp:  [97.6 F (36.4 C)-99.6 F (37.6 C)] 99.6 F (37.6 C) (08/15 0516) Pulse Rate:  [55-73] 55 (08/15 0516) Cardiac Rhythm:  [-] Normal sinus rhythm (08/14 1931) Resp:  [18-20] 18 (08/15 0516) BP: (105-134)/(47-99) 105/47 mmHg (08/15 0516) SpO2:  [97 %-100 %] 97 % (08/15 0516)   Recent Labs Lab 01/04/15 1831  GLUCAP 119*    Recent Labs Lab 01/04/15 1752 01/04/15 1800 01/05/15 0645 01/06/15 1235 01/09/15 0339  NA 137 136 136  --  140  K 4.4 4.2 4.3  --  4.1  CL 99* 98* 100*  --  105  CO2  --  26 25  --  27  GLUCOSE 121* 121* 117*  --  103*  BUN 35* 24* 21*  --  19  CREATININE 1.40* 1.49* 1.29*  --  1.18  CALCIUM  --  9.5 9.1  --  8.8*  MG  --   --   --  2.0  --     Recent Labs Lab 01/04/15 1800 01/05/15 0645  AST 35 35  ALT 51 43  ALKPHOS 131* 119  BILITOT 1.2 1.2  PROT 7.4 7.1  ALBUMIN 3.8 3.4*    Recent Labs Lab 01/04/15 1752 01/04/15 1800  WBC  --  7.0  NEUTROABS  --  4.4  HGB  19.0* 17.5*  HCT 56.0* 50.8  MCV  --  88.5  PLT  --  308   No results for input(s): CKTOTAL, CKMB, CKMBINDEX, TROPONINI in the last 168 hours. No results for input(s): LABPROT, INR in the last 72 hours. No results for input(s): COLORURINE, LABSPEC, PHURINE, GLUCOSEU, HGBUR, BILIRUBINUR, KETONESUR, PROTEINUR, UROBILINOGEN, NITRITE, LEUKOCYTESUR in the last 72 hours.  Invalid input(s): APPERANCEUR     Component Value Date/Time   CHOL 214* 01/05/2015 0645   TRIG 129 01/05/2015 0645   HDL 24* 01/05/2015 0645   CHOLHDL 8.9 01/05/2015 0645   VLDL 26 01/05/2015 0645   LDLCALC 164* 01/05/2015 0645   Lab Results  Component Value Date   HGBA1C 7.0* 01/05/2015      Component Value Date/Time   LABOPIA NONE DETECTED 01/05/2015 1435   COCAINSCRNUR NONE DETECTED 01/05/2015 1435   LABBENZ NONE DETECTED 01/05/2015 1435   AMPHETMU NONE DETECTED 01/05/2015 1435   THCU NONE DETECTED 01/05/2015 1435   LABBARB NONE DETECTED 01/05/2015 1435    No results for input(s): ETH in the last 168 hours.   IMAGING I have personally reviewed the radiological images below and agree with the radiology interpretations.  Ct Angio Head and Neck  W/cm &/or Wo Cm 01/04/2015    1. White matter disease without evidence for acute cortical infarct.  2. Mild attenuation of MCA branch vessels, right greater than left.  3. No significant proximal stenosis, aneurysm, or branch vessel occlusion.  4. Mild atherosclerotic changes within the carotid bifurcations bilaterally without significant stenosis.  5. Degenerative changes in the cervical spine.     Ct Head Wo Contrast 01/04/2015    1. Normal CT appearance the brain.  2. Opacification of the superior aspect of the left frontal sinus with surrounding reactive bone change. This appears to be a chronic process. Recommend correlation with the patient's symptoms.    Mr Brain Wo Contrast 01/04/2015    1. No acute intracranial abnormality.  2. Age advanced atrophy and  white matter disease.  3. 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  4. Frontal sinus disease.     2-D echocardiogram 01/06/2015 EF 20-25% with diffuse hypokinesis and akinesis. No definite cardiac source of emboli identified.  Cardiac cath 1. 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  2. Occluded proximal LAD. Patent LIMA to mid LAD 3. Patent SVG to right Posterolateral artery 4. Occluded SVG to PDA. The PDA fills from native antegrade flow and from the patent graft to the PLA 5. Patent native Circumflex with minimal stenosis.  6. Cardiomyopathy  PHYSICAL EXAM Pleasant middle aged mildly obese Caucasian male not in distress. . Afebrile. Head is nontraumatic. Neck is supple without bruit.    Cardiac exam no murmur or gallop. Lungs are clear to auscultation. Distal pulses are well felt.  Neurological Exam :  Awake alert oriented. No aphasia, dysarthria or apraxia. Follows commands well. Intact attention, registration and recall. Pupils equal reactive. Fundi were not visualized. Vision acuity and fields seem adequate. Face is symmetric without weakness. Tongue is midline. Good cough and gag. Motor system exam revealed no upper or lower extremity drift. Symmetric and equal strength in all 4 extremities. Deep tendon reflexes are 1+ symmetric. Plantars are downgoing. Sensation is intact. Gait was not tested. Deep tendon reflexes are 1+ symmetric. Plantars are downgoing. Sensation appears intact bilaterally. Gait was not tested.   ASSESSMENT/PLAN Darrell Knapp is a 54 y.o. male with history of HTN, previous CABG with postop afib, CHF, HLD, and previous MI,  presenting with transient episodes of speech difficulties, confusion, and RUE weakness.  He did not receive IV t-PA due to resolution of deficits.  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 Seizure less likely.  Resultant  resolution of  deficits  MRI  no acute intracranial abnormality. Remote left temporoparietal infarct.  CTA of head and neck - no high-grade stenosis.  2D Echo - EF 20-25% with diffuse hypokinesis. No definite clot but suboptimal quality study  pt does need TEE to rule out mural thrombus due to low EF  EEG - normal   LDL - 164  HgbA1c 7.0  On Keppra, 1000 mg load followed by 500 bid for questionable seizure although less likely.   Lovenox for VTE prophylaxis Diet Heart Room service appropriate?: Yes; Fluid consistency:: Thin  no antithrombotic prior to admission, now on dural antiplatelet.    Patient counseled to be compliant with his antithrombotic medications  Therapy recommendations: No follow-up physical therapy  Disposition: Pending  Palpitation  Pt reports heat palpitaton and heart racing  No documented atrial fib on telemetry so far.   Had atrial fibrillation  post OHS but not since.   consider outpatient monitor (30 day monitoring or loop recorder) when he returns to [place].  Hypertension  Home meds: Lisinopril and hydrochlorothiazide  Stable  Patient counseled to be compliant with his blood pressure medications  Hyperlipidemia  Home meds:  No lipid lowering medications prior to admission  LDL 164, goal < 70  Add Lipitor 80 mg daily  Continue statin at discharge  Diabetes  HgbA1c 7.0, goal < 7.0  No history of diabetes mellitus  Other Stroke Risk Factors  Obesity, Body mass index is 36.34 kg/(m^2).   Hx of stroke by MRI  Family hx stroke (mother)  Coronary artery disease s/p CABG  Other Active Problems  Mild renal insufficiency (recently placed on ACE inhibitor and hydrochlorothiazide)  Transient episode of aphasia while on 4 N.                         Cardiomyopathy, Severe LV systolic dysfunction   2D w/ EF 20-25%  Decreased energy, endurance about 1.5 mos ago  Cardiac catheterization planned for today  Beta blocker added.  Hospital Day#  4  [facility] See Amion for Pager information 01/09/2015 9:43 AM   I, the attending vascular neurologist, have personally obtained a history, examined the patient, evaluated laboratory data, individually viewed imaging studies and agree with radiology interpretations. I also discussed with pt and Dr. Lendell Caprice regarding his care plan. Together with the NP/PA, we formulated the assessment and plan of care which reflects our mutual decision.  I have made any additions or clarifications directly to the above note and agree with the findings and plan as currently documented.   54 yo M with Hx of HTN, CAD, MI s/p CABG, CHF, HLD was admitted for episodes of aphsia and RUE weakness. Concerning for cortical TIA. His MRI no acute infarct but left temporoparietal old small cortical infarct. His EF 20-25% on TEE and cardiac cath showed multiple vessel occlusion. Pt reports heart palpitation and he had post CABG afib, concerning for cardioembolic source. Will recommend TEE rule out mural thrombus. Also cardiac long term monitoring. Continue dural antiplatelet and statin. However, due to low EF, anticoagulation may also considered. Pt need neurology follow ASAP when go back to [place], MD PhD Stroke Neurology 01/09/2015 10:09 PM      To contact Stroke Continuity provider, please refer to [email or web address]. After hours, contact General Neurology

## 2015-01-09 NOTE — Progress Notes (Signed)
TR BAND REMOVAL  LOCATION:    right radial  DEFLATED PER PROTOCOL:    Yes.    TIME BAND OFF / DRESSING APPLIED:    1545   SITE UPON ARRIVAL:    Level 0  SITE AFTER BAND REMOVAL:    Level 0  REVERSE ALLEN'S TEST:     positive  CIRCULATION SENSATION AND MOVEMENT:    Within Normal Limits   Yes.    COMMENTS:   Tolerated procedure well 

## 2015-01-09 NOTE — H&P (View-Only) (Signed)
Patient ID: Darrell Knapp MRN: [id number], DOB/AGE: 55-Nov-1962   Admit date: 01/04/2015   Primary Physician: No primary care provider on file. Primary Cardiologist: New (followed by a cardiologist in [place])  Pt. Profile:  54 y/o male visiting from [place] with h/o CAD s/p cardiac arrest and CABG x3 2 years ago, HTN and HLD admitted for TIA vs complex partial seizures. Found to have severe LV systolic function on 2D echo. No cardiac thrombus seen on TTE.    Problem List  Past Medical History  Diagnosis Date  . Coronary artery disease   . Cardiac arrest   . Heart attack   . Hypertension     Past Surgical History  Procedure Laterality Date  . Bypass graft       Allergies  No Known Allergies  HPI  54 y/o male visiting [place] from [place]. He is here for work and his wife has traveled with him. He has a reported h/o CAD s/p CABG x3, prior cardiac arrest,  HTN and HLD. He reports seeing his PCP 3 weeks ago for acute onset of dyspnea and fluid retention. He states he was diagnosed with CHF (new diagnosis for him). He was placed on a diuretic. He also recalls his PCP noting something abnormal on his EKG and suggested that he see a specialist for a procedure to ablate an electrical circuit in his heart (sounds like ablation for an arrhthymia). An appointment was scheduled but he has not yet been seen. We are now awaiting his records to be faxed from his PCP and cardiologist in [place].  He was brought to the [facility] ED 01/04/15 after developing right-sided weakness with aphasia and confusion. He denies any prior h/o stroke/TIA.  In ED, CT of head was unremarkable. MRI also w/o any significant findings. He was seen by neurology and placed on ASA and Plavix, however he has continued to have recurrent transient episodes of aphasia and confusion despite therapy. 2D echo was obtained revealing severely reduced LV systolic function, with an EF of 20-25%, with diffuse hypokinesis and akinesis of the entire  anteroseptal and apical myocardium. Wall thickness was increased in a pattern of severe LVH. He has mild mitral regurgitation. The right atrium is mildly dilated. The left atrium is normal in size. There was no mention of any cardiac thrombus.   The patient denies any current chest pain. He noted mild chest tightness 3 weeks ago when he had acute CHF. However his chest pain resolved with diuresis. He states that he did not have typical chest pain prior to his cardiac arrest 2 year ago. He had SCD while exercising but no warning symptoms. He denies any current dyspnea. No LEE. He does endorse a recent history of tachy palpitations. Telemetry currently shows NSR however there is question of possible atrial flutter on prior telemetry strips/ EKGs.       Home Medications  Prior to Admission medications   Medication Sig Start Date End Date Taking? Authorizing Provider  ibuprofen (ADVIL,MOTRIN) 200 MG tablet Take 600 mg by mouth daily as needed for headache.   Yes Historical Provider, MD  lisinopril-hydrochlorothiazide (PRINZIDE,ZESTORETIC) 20-25 MG per tablet Take 1 tablet by mouth daily. 12/16/14  Yes Historical Provider, MD    Family History  Family History  Problem Relation Age of Onset  . Stroke Mother   . Heart attack Father   . Kidney failure Brother     Social History  Social History   Social History  . Marital Status:  Married    Spouse Name: N/A  . Number of Children: N/A  . Years of Education: N/A   Occupational History  . Not on file.   Social History Main Topics  . Smoking status: Never Smoker   . Smokeless tobacco: Not on file  . Alcohol Use: No  . Drug Use: Not on file  . Sexual Activity: Not Currently    Birth Control/ Protection: None   Other Topics Concern  . Not on file   Social History Narrative  . No narrative on file     Review of Systems General:  No chills, fever, night sweats or weight changes.  Cardiovascular:  No chest pain, dyspnea on  exertion, edema, orthopnea, palpitations, paroxysmal nocturnal dyspnea. Dermatological: No rash, lesions/masses Respiratory: No cough, dyspnea Urologic: No hematuria, dysuria Abdominal:   No nausea, vomiting, diarrhea, bright red blood per rectum, melena, or hematemesis Neurologic:  No visual changes, wkns, changes in mental status. All other systems reviewed and are otherwise negative except as noted above.  Physical Exam  Blood pressure 116/73, pulse 68, temperature 97.9 F (36.6 C), temperature source Oral, resp. rate 20, height 5\' 10"  (1.778 m), weight 253 lb 4.8 oz (114.896 kg), SpO2 97 %.  General: Pleasant, NAD Psych: Normal affect. Neuro: Alert and oriented X 3. Moves all extremities spontaneously. HEENT: Normal  Neck: Supple without bruits or JVD. Lungs:  Resp regular and unlabored, CTA. Heart: RRR no s3, s4, or murmurs. Abdomen: Soft, non-tender, non-distended, BS + x 4.  Extremities: No clubbing, cyanosis or edema. DP/PT/Radials 2+ and equal bilaterally.  Labs  Troponin [facility] of Care Test)  Recent Labs  01/04/15 1804  TROPIPOC 0.00   No results for input(s): CKTOTAL, CKMB, TROPONINI in the last 72 hours. Lab Results  Component Value Date   WBC 7.0 01/04/2015   HGB 17.5* 01/04/2015   HCT 50.8 01/04/2015   MCV 88.5 01/04/2015   PLT 308 01/04/2015    Recent Labs Lab 01/05/15 0645  NA 136  K 4.3  CL 100*  CO2 25  BUN 21*  CREATININE 1.29*  CALCIUM 9.1  PROT 7.1  BILITOT 1.2  ALKPHOS 119  ALT 43  AST 35  GLUCOSE 117*   Lab Results  Component Value Date   CHOL 214* 01/05/2015   HDL 24* 01/05/2015   LDLCALC 164* 01/05/2015   TRIG 129 01/05/2015   No results found for: DDIMER   Radiology/Studies  Ct Angio Head W/cm &/or Wo Cm  01/04/2015   CLINICAL DATA:  Episode lasting 45 seconds with acute onset weakness in the right upper and lower extremities. Persistent intermittent aphasia  EXAM: CT ANGIOGRAPHY HEAD AND NECK  TECHNIQUE: Multidetector CT  imaging of the head and neck was performed using the standard protocol during bolus administration of intravenous contrast. Multiplanar CT image reconstructions and MIPs were obtained to evaluate the vascular anatomy. Carotid stenosis measurements (when applicable) are obtained utilizing NASCET criteria, using the distal internal carotid diameter as the denominator.  CONTRAST:  OMNIPAQUE IOHEXOL 350 MG/ML SOLN  COMPARISON:  CT head without contrast from the same day.  FINDINGS: CT HEAD  Brain: The source images demonstrate no evidence for acute or subacute infarction. Basal ganglia are intact. The insular ribbon is intact. No acute cortical infarct is present.  Calvarium and skull base: Within normal limits.  Paranasal sinuses: Mild mucosal thickening is present in the frontal sinuses bilaterally. The paranasal sinuses and mastoid air cells are otherwise clear.  Orbits: Negative  CTA NECK  Aortic arch: A common origin of the left common carotid artery and innominate artery is noted. The study is mildly degraded by patient motion.  Right carotid system: The right common carotid artery is within normal limits. Atherosclerotic calcifications are present at the origin of the right internal carotid artery without significant stenosis. The cervical right ICA is normal.  Left carotid system: The left common carotid artery is within normal limits. Atherosclerotic changes are present at the carotid bifurcation without a significant stenosis relative to the more distal vessel. The more distal cervical ICA is normal.  Vertebral arteries:The vertebral arteries originate from of the subclavian arteries bilaterally. Mild to moderate narrowing is present at proximal left vertebral artery. No focal stenosis or vascular injury is evident within the vertebral arteries.  Skeleton: Mild endplate degenerative changes are present from C3-4 through C5-6. Uncovertebral spurring is worse left than right at C3-4 and C5-6. No focal  lytic or blastic lesions are present.  Other neck: The soft tissues of the neck demonstrate calcifications in the palatine tonsils bilaterally compatible with the history of infection. Lymph nodes are present in the parotid glands bilaterally. Reactive size level 2 and level 3 lymph nodes are present bilaterally. No significant cervical adenopathy is present.  The lung apices are clear  CTA HEAD  Anterior circulation: Minimal atherosclerotic changes are present within the cavernous internal carotid arteries bilaterally without significant stenosis through the ICA termini. The A1 and M1 segments are normal. The MCA bifurcations are intact. There is some attenuation of distal MCA branch vessels, worse on the right.  Posterior circulation: The vertebral arteries are codominant. PICA origins are visualized and normal bilaterally. A fetal type left posterior cerebral artery is present. The right posterior cerebral artery originates from the basilar tip.  Venous sinuses: The dural sinuses are patent. The straight sinus and deep cerebral veins are intact. Cortical veins are within normal limits.  Anatomic variants: Fetal type left posterior cerebral artery.  Delayed phase: The postcontrast images demonstrate no pathologic enhancement.  IMPRESSION: 1. White matter disease without evidence for acute cortical infarct. 2. Mild attenuation of MCA branch vessels, right greater than left. 3. No significant proximal stenosis, aneurysm, or branch vessel occlusion. 4. Mild atherosclerotic changes within the carotid bifurcations bilaterally without significant stenosis. 5. Degenerative changes in the cervical spine.   Electronically Signed   By: Marin Roberts M.D.   On: 01/04/2015 21:33   Dg Chest 2 View  01/05/2015   CLINICAL DATA:  Recent TIAs  EXAM: CHEST - 2 VIEW  COMPARISON:  None.  FINDINGS: Cardiac shadow is within normal limits. Postsurgical changes are seen. The lungs are well aerated bilaterally. No bony  abnormality is seen.  IMPRESSION: No active disease.   Electronically Signed   By: Alcide Clever M.D.   On: 01/05/2015 09:15   Ct Head Wo Contrast  01/04/2015   CLINICAL DATA:  Right-sided weakness and aphasia.  Code stroke.  EXAM: CT HEAD WITHOUT CONTRAST  TECHNIQUE: Contiguous axial images were obtained from the base of the skull through the vertex without intravenous contrast.  COMPARISON:  None.  FINDINGS: No acute cortical infarct, hemorrhage, or mass lesion is present. The ventricles are of normal size. No significant extra-axial fluid collection is evident. Focal opacity in the superior left frontal lobe appears separated by septation. There is surrounding reactive bone change. Paranasal sinuses are otherwise clear. The mastoid air cells are clear.  ASPECTS score = 10/10  [place] Stroke Program Early CT  Score  Normal score = 10  IMPRESSION: 1. Normal CT appearance the brain. 2. Opacification of the superior aspect of the left frontal sinus with surrounding reactive bone change. This appears to be a chronic process. Recommend correlation with the patient's symptoms.  These results were called by telephone at the time of interpretation on 01/04/2015 at 6:17 pm to Dr. Noel Christmas , who verbally acknowledged these results.   Electronically Signed   By: Marin Roberts M.D.   On: 01/04/2015 18:29   Ct Angio Neck W/cm &/or Wo/cm  01/04/2015   CLINICAL DATA:  Episode lasting 45 seconds with acute onset weakness in the right upper and lower extremities. Persistent intermittent aphasia  EXAM: CT ANGIOGRAPHY HEAD AND NECK  TECHNIQUE: Multidetector CT imaging of the head and neck was performed using the standard protocol during bolus administration of intravenous contrast. Multiplanar CT image reconstructions and MIPs were obtained to evaluate the vascular anatomy. Carotid stenosis measurements (when applicable) are obtained utilizing NASCET criteria, using the distal internal carotid diameter as the  denominator.  CONTRAST:  OMNIPAQUE IOHEXOL 350 MG/ML SOLN  COMPARISON:  CT head without contrast from the same day.  FINDINGS: CT HEAD  Brain: The source images demonstrate no evidence for acute or subacute infarction. Basal ganglia are intact. The insular ribbon is intact. No acute cortical infarct is present.  Calvarium and skull base: Within normal limits.  Paranasal sinuses: Mild mucosal thickening is present in the frontal sinuses bilaterally. The paranasal sinuses and mastoid air cells are otherwise clear.  Orbits: Negative  CTA NECK  Aortic arch: A common origin of the left common carotid artery and innominate artery is noted. The study is mildly degraded by patient motion.  Right carotid system: The right common carotid artery is within normal limits. Atherosclerotic calcifications are present at the origin of the right internal carotid artery without significant stenosis. The cervical right ICA is normal.  Left carotid system: The left common carotid artery is within normal limits. Atherosclerotic changes are present at the carotid bifurcation without a significant stenosis relative to the more distal vessel. The more distal cervical ICA is normal.  Vertebral arteries:The vertebral arteries originate from of the subclavian arteries bilaterally. Mild to moderate narrowing is present at proximal left vertebral artery. No focal stenosis or vascular injury is evident within the vertebral arteries.  Skeleton: Mild endplate degenerative changes are present from C3-4 through C5-6. Uncovertebral spurring is worse left than right at C3-4 and C5-6. No focal lytic or blastic lesions are present.  Other neck: The soft tissues of the neck demonstrate calcifications in the palatine tonsils bilaterally compatible with the history of infection. Lymph nodes are present in the parotid glands bilaterally. Reactive size level 2 and level 3 lymph nodes are present bilaterally. No significant cervical adenopathy is  present.  The lung apices are clear  CTA HEAD  Anterior circulation: Minimal atherosclerotic changes are present within the cavernous internal carotid arteries bilaterally without significant stenosis through the ICA termini. The A1 and M1 segments are normal. The MCA bifurcations are intact. There is some attenuation of distal MCA branch vessels, worse on the right.  Posterior circulation: The vertebral arteries are codominant. PICA origins are visualized and normal bilaterally. A fetal type left posterior cerebral artery is present. The right posterior cerebral artery originates from the basilar tip.  Venous sinuses: The dural sinuses are patent. The straight sinus and deep cerebral veins are intact. Cortical veins are within normal limits.  Anatomic variants:  Fetal type left posterior cerebral artery.  Delayed phase: The postcontrast images demonstrate no pathologic enhancement.  IMPRESSION: 1. White matter disease without evidence for acute cortical infarct. 2. Mild attenuation of MCA branch vessels, right greater than left. 3. No significant proximal stenosis, aneurysm, or branch vessel occlusion. 4. Mild atherosclerotic changes within the carotid bifurcations bilaterally without significant stenosis. 5. Degenerative changes in the cervical spine.   Electronically Signed   By: Marin Roberts M.D.   On: 01/04/2015 21:33   Mr Brain Wo Contrast  01/04/2015   CLINICAL DATA:  Acute onset of intermittent aphasia. Episode of right hemiparesis.  EXAM: MRI HEAD WITHOUT CONTRAST  TECHNIQUE: Multiplanar, multiecho pulse sequences of the brain and surrounding structures were obtained without intravenous contrast.  COMPARISON:  CTA head and neck from the same day.  FINDINGS: The diffusion-weighted images demonstrate no evidence for acute or subacute infarction. Mild atrophy and white matter change is slightly advanced for age.  The ventricles are of normal size. A remote left temporal parietal lobe infarct is  noted. No significant extraaxial fluid collection is present.  Flow is present in the major intracranial arteries. The globes and orbits are intact. Mild mucosal thickening is present in the anterior ethmoid air cells and frontal sinuses. A sequestered area in the superior left frontal sinus is opacified with osseous changes overlying this area, likely reactive.  The skullbase is within normal limits. Midline structures are unremarkable.  IMPRESSION: 1. No acute intracranial abnormality. 2. Age advanced atrophy and white matter disease. 3. 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 4. Frontal sinus disease.   Electronically Signed   By: Marin Roberts M.D.   On: 01/04/2015 21:36    ECG  RBBB  Echocardiogram 01/06/15  Study Conclusions  - Procedure narrative: Transthoracic echocardiography. Image quality was suboptimal. The study was technically difficult, as a result of poor acoustic windows, poor sound wave transmission, and body habitus. - Left ventricle: The cavity size was mildly dilated. Wall thickness was increased in a pattern of severe LVH. Systolic function was severely reduced. The estimated ejection fraction was in the range of 20% to 25%. Diffuse hypokinesis. Akinesis of the entireanteroseptal and apical myocardium. - Mitral valve: There was mild regurgitation. - Right atrium: The atrium was mildly dilated.    ASSESSMENT AND PLAN  Principal Problem:   TIA (transient ischemic attack) Active Problems:   Hypertension   CAD (coronary artery disease)   Renal failure   Aphasia   Sinusitis, chronic  1. ? TIA vs seizure: patient continues to have recurrent transient aphasia and confusion, despite ASA and Plavix therapy. No carotid disease on CT of neck. No cardiac embolic sources identified on TTE. Plus he had a normal MRI which makes embolus from cardiac source less likely. Doubt much benefit from TEE. Continue management per neurology.  May also potentially need a loop recorder implanted to assess for arhythmias. For now continue to monitor on telemetry.  2. LV Systolic Dysfunction: Patient notes new diagnosis of CHF 3 weeks ago by PCP. 2D echo here shows severe LV dysfunction with EF at 20-25%. He is euvolemic on physical exam w/o dyspnea. Continue ACE-I therapy with lisinopril.  Add BB therapy if BP and HR can tolerate. Low sodium diet. Will need further eval to r/o ischemia as potential cause of his new cardiomyopathy. 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  3. CAD: h/o SCD and CABG x 3 two years  ago. Now with new cardiomyopathy with EF at 20-25%. Given history of silent ischemia, SCD and CABG, will need an ischemic eval to rule out graft failure. Patient plans to stay for cath on Monday. Continue ASA, Plavix and statin for now.    Beau Fanny, PA-C 01/06/2015, 3:17 PM   Patient seen and examined with Robbie Lis PA-C. We discussed all aspects of the encounter. I agree with the assessment and plan as stated above. He has h/o CAD s/p CABG with previous cardiac arrest. Unaware of previous EF (has not seen a cardiologist in 2 years. His PCP said he might have an irregular heart rhtyhm.) He presents with several episodes of aphasia and RUE weakness. MRI brain and CT angio head/neck have been completely negative. Echo shows EF 20-25% - unclear if this is new or old but has had recent onset of HF symptoms.   Would recommend cardiac cath to assess coronaries and grafts as cause of new LV dysfunction. Continue to follow on tele and if no arrhythmias consider outpatient montior when he returns to [place]. TIA work-up per Neurology doubt cardioembolic source with negative MRI but will schedule TEE if Neurology feels it will be helpful.   Start b-blocker. Continue lisinopril, statin and ASA.   Jereme Loren,MD 5:41 PM

## 2015-01-10 ENCOUNTER — Encounter (HOSPITAL_COMMUNITY): Payer: Self-pay

## 2015-01-10 ENCOUNTER — Encounter (HOSPITAL_COMMUNITY)
Admission: EM | Disposition: A | Discharge status: Home / Self Care | Payer: Self-pay | Source: Home / Self Care | Attending: Emergency Medicine

## 2015-01-10 ENCOUNTER — Ambulatory Visit (HOSPITAL_BASED_OUTPATIENT_CLINIC_OR_DEPARTMENT_OTHER): Payer: Managed Care, Other (non HMO)

## 2015-01-10 DIAGNOSIS — R4701 Aphasia: Secondary | ICD-10-CM | POA: Diagnosis not present

## 2015-01-10 DIAGNOSIS — G451 Carotid artery syndrome (hemispheric): Secondary | ICD-10-CM | POA: Diagnosis not present

## 2015-01-10 DIAGNOSIS — I10 Essential (primary) hypertension: Secondary | ICD-10-CM

## 2015-01-10 DIAGNOSIS — R931 Abnormal findings on diagnostic imaging of heart and coronary circulation: Secondary | ICD-10-CM | POA: Diagnosis not present

## 2015-01-10 DIAGNOSIS — G40219 Localization-related (focal) (partial) symptomatic epilepsy and epileptic syndromes with complex partial seizures, intractable, without status epilepticus: Secondary | ICD-10-CM

## 2015-01-10 DIAGNOSIS — I429 Cardiomyopathy, unspecified: Secondary | ICD-10-CM | POA: Diagnosis not present

## 2015-01-10 DIAGNOSIS — I2510 Atherosclerotic heart disease of native coronary artery without angina pectoris: Secondary | ICD-10-CM | POA: Diagnosis not present

## 2015-01-10 DIAGNOSIS — I420 Dilated cardiomyopathy: Secondary | ICD-10-CM | POA: Diagnosis not present

## 2015-01-10 HISTORY — PX: TEE WITHOUT CARDIOVERSION: SHX5443

## 2015-01-10 SURGERY — ECHOCARDIOGRAM, TRANSESOPHAGEAL
Anesthesia: Moderate Sedation

## 2015-01-10 MED ORDER — CARVEDILOL 3.125 MG PO TABS: 3.1250 mg | ORAL_TABLET | Freq: Two times a day (BID) | ORAL | Status: AC

## 2015-01-10 MED ORDER — LEVETIRACETAM 500 MG PO TABS: 500.0000 mg | ORAL_TABLET | Freq: Two times a day (BID) | ORAL | Status: AC

## 2015-01-10 MED ORDER — DIPHENHYDRAMINE HCL 50 MG/ML IJ SOLN
INTRAMUSCULAR | Status: AC
  Filled 2015-01-10: qty 1

## 2015-01-10 MED ORDER — FENTANYL CITRATE (PF) 100 MCG/2ML IJ SOLN
INTRAMUSCULAR | Status: DC | PRN
  Administered 2015-01-10 (×2): 25 ug via INTRAVENOUS

## 2015-01-10 MED ORDER — CLOPIDOGREL BISULFATE 75 MG PO TABS: 75.0000 mg | ORAL_TABLET | Freq: Every day | ORAL | Status: AC

## 2015-01-10 MED ORDER — LIDOCAINE VISCOUS 2 % MT SOLN
OROMUCOSAL | Status: AC
  Filled 2015-01-10: qty 15

## 2015-01-10 MED ORDER — MIDAZOLAM HCL 5 MG/ML IJ SOLN
INTRAMUSCULAR | Status: AC
  Filled 2015-01-10: qty 2

## 2015-01-10 MED ORDER — LIDOCAINE VISCOUS 2 % MT SOLN
OROMUCOSAL | Status: DC | PRN
  Administered 2015-01-10: 10 mL via OROMUCOSAL

## 2015-01-10 MED ORDER — LISINOPRIL 20 MG PO TABS: 20.0000 mg | ORAL_TABLET | Freq: Every day | ORAL | Status: AC

## 2015-01-10 MED ORDER — FENTANYL CITRATE (PF) 100 MCG/2ML IJ SOLN
INTRAMUSCULAR | Status: AC
  Filled 2015-01-10: qty 2

## 2015-01-10 MED ORDER — MIDAZOLAM HCL 10 MG/2ML IJ SOLN
INTRAMUSCULAR | Status: DC | PRN
  Administered 2015-01-10: 1 mg via INTRAVENOUS
  Administered 2015-01-10: 2 mg via INTRAVENOUS
  Administered 2015-01-10: 1 mg via INTRAVENOUS

## 2015-01-10 MED ORDER — ATORVASTATIN CALCIUM 80 MG PO TABS: 80.0000 mg | ORAL_TABLET | Freq: Every day | ORAL | Status: AC

## 2015-01-10 MED FILL — Lidocaine HCl Local Preservative Free (PF) Inj 1%: INTRAMUSCULAR | Qty: 30 | Status: AC

## 2015-01-10 NOTE — CV Procedure (Signed)
Transesophageal echo  Moderate to severe global hypokinesis.  LVEF 20-25% Moderate MR and moderate-severe TR.  RVSP 58 mmHg. No LAA or LA thrombus or mass. No PFO or ASD.  Negative bubble study.  Please see full report for further details.

## 2015-01-10 NOTE — Progress Notes (Signed)
Patient Name: Darrell Knapp Date of Encounter: 01/10/2015  Principal Problem:   TIA (transient ischemic attack) Active Problems:   Ischemic cardiomyopathy- 20-25%   Hypertension   CAD- hx of CABG. Cath 01/09/15- medical Rx   Complex partial siezure   Dyslipidemia  SUBJECTIVE  Denies chest pain, sob or palpitation.   CURRENT MEDS . aspirin EC  325 mg Oral Daily  . atorvastatin  80 mg Oral q1800  . carvedilol  3.125 mg Oral BID WC  . clopidogrel  75 mg Oral Daily  . fluticasone  2 spray Each Nare Daily  . levETIRAcetam  500 mg Oral BID  . lisinopril  20 mg Oral Daily  . sodium chloride  3 mL Intravenous Q12H    OBJECTIVE  Filed Vitals:   01/09/15 1618 01/09/15 2000 01/09/15 2016 01/10/15 0418  BP: 119/70  126/79 106/70  Pulse: 70  71 67  Temp: 98 F (36.7 C)  97.4 F (36.3 C) 98 F (36.7 C)  TempSrc: Oral  Axillary Oral  Resp: Height:      Weight:    250 lb 14.1 oz (113.8 kg)  SpO2: 100%  98% 97%    Intake/Output Summary (Last 24 hours) at 01/10/15 0710 Last data filed at 01/09/15 1900  Gross per 24 hour  Intake 388.33 ml  Output    520 ml  Net -131.67 ml   Filed Weights   01/04/15 1800 01/04/15 2300 01/10/15 0418  Weight: 259 lb 11.2 oz (117.8 kg) 253 lb 4.8 oz (114.896 kg) 250 lb 14.1 oz (113.8 kg)    PHYSICAL EXAM  General: Pleasant, NAD. Neuro: Alert and oriented X 3. Moves all extremities spontaneously. Psych: Normal affect. HEENT:  Normal  Neck: Supple without bruits or JVD. Lungs:  Resp regular and unlabored, CTA. Heart: RRR no s3, s4, or murmurs. Abdomen: Soft, non-tender, non-distended, BS + x 4.  Extremities: Lt radial site without hematoma  Accessory Clinical Findings  CBC No results for input(s): WBC, NEUTROABS, HGB, HCT, MCV, PLT in the last 72 hours. Basic Metabolic Panel  Recent Labs  01/09/15 0339  NA 140  K 4.1  CL 105  CO2 27  GLUCOSE 103*  BUN 19  CREATININE 1.18  CALCIUM 8.8*    No results for  input(s): TSH, T4TOTAL, T3FREE, THYROIDAB in the last 72 hours.  Invalid input(s): FREET3  TELE NSR at rate of 50s.   Echo: 01/06/15 Study Conclusions  - Procedure narrative: Transthoracic echocardiography. Image quality was suboptimal. The study was technically difficult, as a result of poor acoustic windows, poor sound wave transmission, and body habitus. - Left ventricle: The cavity size was mildly dilated. Wall thickness was increased in a pattern of severe LVH. Systolic function was severely reduced. The estimated ejection fraction was in the range of 20% to 25%. Diffuse hypokinesis. Akinesis of the entireanteroseptal and apical myocardium. - Mitral valve: There was mild regurgitation. - Right atrium: The atrium was mildly dilated.    ASSESSMENT AND PLAN   1. LV Systolic Dysfunction: -  2D echo here shows severe LV dysfunction with EF at 20-25%. He is euvolemic on physical exam w/o dyspnea. - Continue ACE, BB and statin.  Low sodium diet.  - Cath 01/09/15-occluded SVG-PDA-Med Rx No arrhythmias on tele. Can f/u with his cardiologist at [facility].   2. CAD: -  h/o SCD and CABG x 3 two years ago.    3. TIAs?  - per neurology  - Possible recurrent  left brain TIAs in setting of remote L cortical infarct and low EF. Seizure less likely. - MRI - no acute intracranial abnormality. Remote left temporoparietal infarct. - Neuro recommended TEE scheduled for today.      Abelino Derrick, PA-C 01/10/2015 7:10 AM  Pager [phone]  NOTE: at 7:40 pt had 9 bts NSVT.    I have examined the patient and reviewed assessment and plan and discussed with patient.  Agree with above as stated.  TEE today.  He will f/u with a cardiologist in [place].  Continue carvedilol 3.125 mg BID.   VARANASI,JAYADEEP S.

## 2015-01-10 NOTE — Progress Notes (Signed)
STROKE TEAM PROGRESS NOTE   HISTORY Darrell Knapp is a 54 y.o. male with a history of hypertension, hyperlipidemia and coronary artery disease with myocardial infarction, presenting with acute onset of aphasia as well as right hemiparesis. Patient had difficulty formulating what he wanted to say as well as noticeable errors in speech output. Symptoms lasted for a few minutes then resolved. He has no previous history of stroke nor TIA. He has not been on and a platelet therapy. CT scan of his head showed no acute intracranial abnormality. NIH score at the time of this evaluation was 0.  LSN: 4:00 PM on 01/04/2015 tPA Given: No: Deficits resolved mRankin:   SUBJECTIVE (INTERVAL HISTORY) Wife is at  Bedside. Waiting for TEE. Discussed with patient about low EF and a stroke, encourage him to discuss with his local neurologist and cardiologist regarding anticoagulation versus antiplatelet for stroke prevention in the setting of sinus rhythm and low EF. He will follow-up with his local neurologist in [place].   OBJECTIVE Temp:  [97.4 F (36.3 C)-98.1 F (36.7 C)] 97.4 F (36.3 C) (08/16 1643) Pulse Rate:  [57-79] 73 (08/16 1643) Cardiac Rhythm:  [-] Sinus bradycardia (08/16 0742) Resp:  [10-31] 19 (08/16 1643) BP: (96-146)/(69-98) 133/89 mmHg (08/16 1643) SpO2:  [94 %-100 %] 98 % (08/16 1643) Weight:  [250 lb 14.1 oz (113.8 kg)] 250 lb 14.1 oz (113.8 kg) (08/16 0418)   Recent Labs Lab 01/04/15 1831  GLUCAP 119*    Recent Labs Lab 01/04/15 1752 01/04/15 1800 01/05/15 0645 01/06/15 1235 01/09/15 0339  NA 137 136 136  --  140  K 4.4 4.2 4.3  --  4.1  CL 99* 98* 100*  --  105  CO2  --  26 25  --  27  GLUCOSE 121* 121* 117*  --  103*  BUN 35* 24* 21*  --  19  CREATININE 1.40* 1.49* 1.29*  --  1.18  CALCIUM  --  9.5 9.1  --  8.8*  MG  --   --   --  2.0  --     Recent Labs Lab 01/04/15 1800 01/05/15 0645  AST 35 35  ALT 51 43  ALKPHOS 131* 119  BILITOT 1.2 1.2  PROT 7.4  7.1  ALBUMIN 3.8 3.4*    Recent Labs Lab 01/04/15 1752 01/04/15 1800  WBC  --  7.0  NEUTROABS  --  4.4  HGB 19.0* 17.5*  HCT 56.0* 50.8  MCV  --  88.5  PLT  --  308   No results for input(s): CKTOTAL, CKMB, CKMBINDEX, TROPONINI in the last 168 hours. No results for input(s): LABPROT, INR in the last 72 hours. No results for input(s): COLORURINE, LABSPEC, PHURINE, GLUCOSEU, HGBUR, BILIRUBINUR, KETONESUR, PROTEINUR, UROBILINOGEN, NITRITE, LEUKOCYTESUR in the last 72 hours.  Invalid input(s): APPERANCEUR     Component Value Date/Time   CHOL 214* 01/05/2015 0645   TRIG 129 01/05/2015 0645   HDL 24* 01/05/2015 0645   CHOLHDL 8.9 01/05/2015 0645   VLDL 26 01/05/2015 0645   LDLCALC 164* 01/05/2015 0645   Lab Results  Component Value Date   HGBA1C 7.0* 01/05/2015      Component Value Date/Time   LABOPIA NONE DETECTED 01/05/2015 1435   COCAINSCRNUR NONE DETECTED 01/05/2015 1435   LABBENZ NONE DETECTED 01/05/2015 1435   AMPHETMU NONE DETECTED 01/05/2015 1435   THCU NONE DETECTED 01/05/2015 1435   LABBARB NONE DETECTED 01/05/2015 1435    No results for input(s): ETH in the  last 168 hours.   IMAGING I have personally reviewed the radiological images below and agree with the radiology interpretations.  Ct Angio Head and Neck W/cm &/or Wo Cm 01/04/2015    1. White matter disease without evidence for acute cortical infarct.  2. Mild attenuation of MCA branch vessels, right greater than left.  3. No significant proximal stenosis, aneurysm, or branch vessel occlusion.  4. Mild atherosclerotic changes within the carotid bifurcations bilaterally without significant stenosis.  5. Degenerative changes in the cervical spine.     Ct Head Wo Contrast 01/04/2015    1. Normal CT appearance the brain.  2. Opacification of the superior aspect of the left frontal sinus with surrounding reactive bone change. This appears to be a chronic process. Recommend correlation with the patient's  symptoms.    Mr Brain Wo Contrast 01/04/2015    1. No acute intracranial abnormality.  2. Age advanced atrophy and white matter disease.  3. 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  4. Frontal sinus disease.     2-D echocardiogram 01/06/2015 EF 20-25% with diffuse hypokinesis and akinesis. No definite cardiac source of emboli identified.  Cardiac cath 1. 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  2. Occluded proximal LAD. Patent LIMA to mid LAD 3. Patent SVG to right Posterolateral artery 4. Occluded SVG to PDA. The PDA fills from native antegrade flow and from the patent graft to the PLA 5. Patent native Circumflex with minimal stenosis.  6. Cardiomyopathy  TEE Moderate to severe global hypokinesis. LVEF 20-25% Moderate MR and moderate-severe TR. RVSP 58 mmHg. No LAA or LA thrombus or mass. No PFO or ASD. Negative bubble study.  PHYSICAL EXAM Pleasant middle aged mildly obese Caucasian male not in distress. . Afebrile. Head is nontraumatic. Neck is supple without bruit.    Cardiac exam no murmur or gallop. Lungs are clear to auscultation. Distal pulses are well felt.  Neurological Exam :  Awake alert oriented. No aphasia, dysarthria or apraxia. Follows commands well. Intact attention, registration and recall. Pupils equal reactive. Fundi were not visualized. Vision acuity and fields seem adequate. Face is symmetric without weakness. Tongue is midline. Good cough and gag. Motor system exam revealed no upper or lower extremity drift. Symmetric and equal strength in all 4 extremities. Deep tendon reflexes are 1+ symmetric. Plantars are downgoing. Sensation is intact. Gait was not tested. Deep tendon reflexes are 1+ symmetric. Plantars are downgoing. Sensation appears intact bilaterally. Gait was not tested.   ASSESSMENT/PLAN [name] is a 54 y.o. male with history of HTN, previous CABG with postop afib, CHF, HLD, and  previous MI,  presenting with transient episodes of speech difficulties, confusion, and RUE weakness.  He did not receive IV t-PA due to resolution of deficits.  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 Seizure less likely.  Resultant  resolution of deficits  MRI  no acute intracranial abnormality. Remote left temporoparietal infarct.  CTA of head and neck - no high-grade stenosis.  2D Echo - EF 20-25% with diffuse hypokinesis.   TEE - EF 20-20%, no thrombus, no PFO  EEG - normal   LDL - 164  HgbA1c 7.0  On Keppra, 1000 mg load followed by 500 bid for questionable seizure although less likely.   Lovenox for VTE prophylaxis Diet NPO time specified Diet - low sodium heart healthy  no antithrombotic prior to admission, now on dural  antiplatelet. Patient needed to discuss with his local neurologist and cardiologist regarding anticoagulation versus antiplatelet in the setting of stroke, low EF, and sinus rhythm.   Patient counseled to be compliant with his antithrombotic medications  Therapy recommendations: No follow-up physical therapy  Disposition: Pending  Palpitation  Pt reports heat palpitaton and heart racing  No documented atrial fib on telemetry so far.   Had atrial fibrillation post OHS but not since.   consider outpatient monitor (30 day monitoring or loop recorder) when he returns to [place].  Hypertension  Home meds: Lisinopril and hydrochlorothiazide  Stable  Patient counseled to be compliant with his blood pressure medications  Hyperlipidemia  Home meds:  No lipid lowering medications prior to admission  LDL 164, goal < 70  Add Lipitor 80 mg daily  Continue statin at discharge  Diabetes  HgbA1c 7.0, goal < 7.0  No history of diabetes mellitus  Other Stroke Risk Factors  Obesity, Body mass index is 36 kg/(m^2).   Hx of stroke by MRI  Family hx stroke (mother)  Coronary artery disease s/p  CABG  Other Active Problems  Mild renal insufficiency (recently placed on ACE inhibitor and hydrochlorothiazide)                         Cardiomyopathy, Severe LV systolic dysfunction   2D w/ EF 20-25%  Decreased energy, endurance about 1.5 mos ago  Cardiac catheterization planned for today  Beta blocker added.  Hospital Day# 4  Neurology will sign off. Please call with questions. Pt will follow up with local neurologist ASAP after discharge. Thanks for the consult.   Marvel Plan, MD PhD Stroke Neurology 01/10/2015 6:32 PM      To contact Stroke Continuity provider, please refer to [email or web address]. After hours, contact General Neurology

## 2015-01-10 NOTE — Discharge Summary (Signed)
Physician Discharge Summary  Darrell Knapp WUJ:[id number] DOB: Nov 15, 1960 DOA: 01/04/2015  PCP: No primary care provider on file.  Admit date: 01/04/2015 Discharge date: 01/10/2015  Time spent: 35 minutes  Recommendations for Outpatient Follow-up:  1. 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 male with history of CAD status post CABG, cardiac arrest and hypertension was brought to the ER after patient was found to have right-sided weakness with aphasia and confusion which lasted for around 10 minutes. It happened around last evening 4 PM. On the way to the hospital patient also had a similar episode again which resolved without any intervention. Patient had an episode later again. CT head MRI brain and CT angiogram of the head and neck are unremarkable. Neurologist has seen patient in consult and patient was given loading dose of Plavix along with aspirin. Patient states he was recently started on lisinopril and HCTZ for his hypertension 3 weeks ago when patient had some chest discomfort. Presently has no chest discomfort. Denies any shortness of breath. Patient otherwise denies any visual symptoms difficulty swallowing. On exam patient is able to move all extremities.   Hospital Course:  Stuttering TIA (transient ischemic attack) with recurrent episodes of aphasia. LDL 164. Started statin. On ASA/plavix  Echo shows ejection fraction of 20% but no source of embolus. EEG negative. hgb a1c 7. Neurology has started Keppra in case complex partial seizures.  -NFA:OZHYQMVH to severe global hypokinesis. LVEF 20-25% Moderate MR and moderate-severe TR. RVSP 58 mmHg. No LAA or LA thrombus or mass. No PFO or ASD. Negative bubble study -Needs event monitor v. Loop recorder arranged in [place] after discharge. Continue keppra and needs f/u with neurology in [place]. May be able to stop AEDs  ischemic cardiomyopathy: Already on ACE inhibitor. Beta blocker started. Cath   Prox LAD lesion, 100% stenosed.  LIMA graft to mid LAD was injected, is normal in caliber, and is anatomically normal.  Prox RCA lesion, 50% stenosed.  Mid RCA lesion, 90% stenosed.  Prox Cx lesion, 20% stenosed.  SVG to PLA patent  SVG to PDA is occluded  1. 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  2. Occluded proximal LAD. Patent LIMA to mid LAD 3. Patent SVG to right Posterolateral artery 4. Occluded SVG to PDA. The PDA fills from native antegrade flow and from the patent graft to the PLA 5. Patent native Circumflex with minimal stenosis.  6. Cardiomyopathy  Recommendations: Medical management of CAD and cardiomyopathy.  HLD. Now on statin  Hypertension  CAD (coronary artery disease)  Renal insufficiency: baseline unknown  Aphasia  Sinusitis, chronic: added flonase. Patient reports improvement in symptoms.   Procedures:  Cath  TEE  Consultations:  Cardiology  neurology  Discharge Exam: Filed Vitals:   01/10/15 1619  BP: 119/73  Pulse: 57  Temp: 97.7 F (36.5 C)  Resp: 19  Discharge Instructions   Discharge Instructions    Diet - low sodium heart healthy    Complete by:  As directed      Discharge instructions    Complete by:  As directed   Needs neurology and cardiology follow up in [place]     Increase activity slowly    Complete by:  As directed            Current Discharge Medication List    START taking these medications   Details  aspirin EC 325 MG EC tablet Take 1 tablet (325 mg total) by mouth daily. Qty: 30 tablet, Refills: 0    atorvastatin (LIPITOR) 80 MG tablet Take 1 tablet (80 mg total) by mouth daily at 6 PM. Qty: 30 tablet, Refills: 0    carvedilol (COREG) 3.125 MG tablet Take 1 tablet (3.125 mg total) by mouth 2 (two) times daily with a meal. Qty: 60 tablet, Refills: 0    clopidogrel (PLAVIX) 75 MG tablet Take 1 tablet (75 mg total) by mouth daily. Qty: 30 tablet, Refills: 0    levETIRAcetam (KEPPRA) 500 MG tablet Take 1 tablet (500 mg total) by mouth 2 (two) times daily. Qty: 60 tablet, Refills: 0    lisinopril (PRINIVIL,ZESTRIL) 20 MG tablet Take 1 tablet (20 mg total) by mouth daily. Qty: 30 tablet, Refills: 0      STOP taking these medications     ibuprofen (ADVIL,MOTRIN) 200 MG tablet      lisinopril-hydrochlorothiazide (PRINZIDE,ZESTORETIC) 20-25 MG per tablet        No Known Allergies Follow-up Information    Follow up with cardiologist in [place].      Follow up with neurologist in [place].       The results of significant diagnostics from this hospitalization (including imaging, microbiology, ancillary and laboratory) are listed below for reference.    Significant Diagnostic Studies: Ct Angio Head W/cm &/or Wo Cm  01/04/2015   CLINICAL DATA:  Episode lasting 45 seconds with acute onset weakness in the right upper and lower extremities. Persistent intermittent aphasia  EXAM: CT ANGIOGRAPHY HEAD AND NECK  TECHNIQUE: Multidetector CT imaging of the head and neck was performed using the standard protocol during bolus administration of intravenous contrast. Multiplanar CT image reconstructions and MIPs were obtained to evaluate the vascular anatomy. Carotid stenosis measurements (when applicable) are obtained utilizing NASCET criteria, using the distal internal carotid diameter as the denominator.   CONTRAST:  OMNIPAQUE IOHEXOL 350 MG/ML SOLN  COMPARISON:  CT head without contrast from the same day.  FINDINGS: CT HEAD  Brain: The source images demonstrate no evidence for acute or subacute infarction. Basal ganglia are intact. The insular ribbon is intact. No acute cortical infarct is present.  Calvarium and skull base: Within normal limits.  Paranasal sinuses: Mild mucosal thickening is present in the frontal sinuses bilaterally. The paranasal sinuses and mastoid air cells are otherwise clear.  Orbits: Negative  CTA NECK  Aortic arch: A common origin of the left common carotid artery and innominate artery is noted. The study is mildly degraded by patient motion.  Right carotid system: The right common carotid artery is within normal limits. Atherosclerotic calcifications are present at the origin of the right internal carotid artery without significant stenosis. The cervical right ICA is normal.  Left carotid system: The left common carotid artery is within normal limits. Atherosclerotic changes are present at the carotid bifurcation without a significant stenosis relative to the more distal vessel. The more distal  cervical ICA is normal.  Vertebral arteries:The vertebral arteries originate from of the subclavian arteries bilaterally. Mild to moderate narrowing is present at proximal left vertebral artery. No focal stenosis or vascular injury is evident within the vertebral arteries.  Skeleton: Mild endplate degenerative changes are present from C3-4 through C5-6. Uncovertebral spurring is worse left than right at C3-4 and C5-6. No focal lytic or blastic lesions are present.  Other neck: The soft tissues of the neck demonstrate calcifications in the palatine tonsils bilaterally compatible with the history of infection. Lymph nodes are present in the parotid glands bilaterally. Reactive size level 2 and level 3 lymph nodes are present bilaterally. No significant cervical adenopathy is present.  The lung  apices are clear  CTA HEAD  Anterior circulation: Minimal atherosclerotic changes are present within the cavernous internal carotid arteries bilaterally without significant stenosis through the ICA termini. The A1 and M1 segments are normal. The MCA bifurcations are intact. There is some attenuation of distal MCA branch vessels, worse on the right.  Posterior circulation: The vertebral arteries are codominant. PICA origins are visualized and normal bilaterally. A fetal type left posterior cerebral artery is present. The right posterior cerebral artery originates from the basilar tip.  Venous sinuses: The dural sinuses are patent. The straight sinus and deep cerebral veins are intact. Cortical veins are within normal limits.  Anatomic variants: Fetal type left posterior cerebral artery.  Delayed phase: The postcontrast images demonstrate no pathologic enhancement.  IMPRESSION: 1. White matter disease without evidence for acute cortical infarct. 2. Mild attenuation of MCA branch vessels, right greater than left. 3. No significant proximal stenosis, aneurysm, or branch vessel occlusion. 4. Mild atherosclerotic changes within the carotid bifurcations bilaterally without significant stenosis. 5. Degenerative changes in the cervical spine.   Electronically Signed   By: Marin Roberts M.D.   On: 01/04/2015 21:33   Dg Chest 2 View  01/05/2015   CLINICAL DATA:  Recent TIAs  EXAM: CHEST - 2 VIEW  COMPARISON:  None.  FINDINGS: Cardiac shadow is within normal limits. Postsurgical changes are seen. The lungs are well aerated bilaterally. No bony abnormality is seen.  IMPRESSION: No active disease.   Electronically Signed   By: Alcide Clever M.D.   On: 01/05/2015 09:15   Ct Head Wo Contrast  01/04/2015   CLINICAL DATA:  Right-sided weakness and aphasia.  Code stroke.  EXAM: CT HEAD WITHOUT CONTRAST  TECHNIQUE: Contiguous axial images were obtained from the base of the skull through the vertex without intravenous  contrast.  COMPARISON:  None.  FINDINGS: No acute cortical infarct, hemorrhage, or mass lesion is present. The ventricles are of normal size. No significant extra-axial fluid collection is evident. Focal opacity in the superior left frontal lobe appears separated by septation. There is surrounding reactive bone change. Paranasal sinuses are otherwise clear. The mastoid air cells are clear.  ASPECTS score = 10/10  [place] Stroke Program Early CT Score  Normal score = 10  IMPRESSION: 1. Normal CT appearance the brain. 2. Opacification of the superior aspect of the left frontal sinus with surrounding reactive bone change. This appears to be a chronic process. Recommend correlation with the patient's symptoms.  These results were called by telephone at the time of interpretation on 01/04/2015 at 6:17 pm to Dr. Noel Christmas , who verbally acknowledged these results.   Electronically Signed   By: Marin Roberts M.D.   On: 01/04/2015 18:29   Ct Angio Neck W/cm &/or Wo/cm  01/04/2015  CLINICAL DATA:  Episode lasting 45 seconds with acute onset weakness in the right upper and lower extremities. Persistent intermittent aphasia  EXAM: CT ANGIOGRAPHY HEAD AND NECK  TECHNIQUE: Multidetector CT imaging of the head and neck was performed using the standard protocol during bolus administration of intravenous contrast. Multiplanar CT image reconstructions and MIPs were obtained to evaluate the vascular anatomy. Carotid stenosis measurements (when applicable) are obtained utilizing NASCET criteria, using the distal internal carotid diameter as the denominator.  CONTRAST:  OMNIPAQUE IOHEXOL 350 MG/ML SOLN  COMPARISON:  CT head without contrast from the same day.  FINDINGS: CT HEAD  Brain: The source images demonstrate no evidence for acute or subacute infarction. Basal ganglia are intact. The insular ribbon is intact. No acute cortical infarct is present.  Calvarium and skull base: Within normal limits.  Paranasal  sinuses: Mild mucosal thickening is present in the frontal sinuses bilaterally. The paranasal sinuses and mastoid air cells are otherwise clear.  Orbits: Negative  CTA NECK  Aortic arch: A common origin of the left common carotid artery and innominate artery is noted. The study is mildly degraded by patient motion.  Right carotid system: The right common carotid artery is within normal limits. Atherosclerotic calcifications are present at the origin of the right internal carotid artery without significant stenosis. The cervical right ICA is normal.  Left carotid system: The left common carotid artery is within normal limits. Atherosclerotic changes are present at the carotid bifurcation without a significant stenosis relative to the more distal vessel. The more distal cervical ICA is normal.  Vertebral arteries:The vertebral arteries originate from of the subclavian arteries bilaterally. Mild to moderate narrowing is present at proximal left vertebral artery. No focal stenosis or vascular injury is evident within the vertebral arteries.  Skeleton: Mild endplate degenerative changes are present from C3-4 through C5-6. Uncovertebral spurring is worse left than right at C3-4 and C5-6. No focal lytic or blastic lesions are present.  Other neck: The soft tissues of the neck demonstrate calcifications in the palatine tonsils bilaterally compatible with the history of infection. Lymph nodes are present in the parotid glands bilaterally. Reactive size level 2 and level 3 lymph nodes are present bilaterally. No significant cervical adenopathy is present.  The lung apices are clear  CTA HEAD  Anterior circulation: Minimal atherosclerotic changes are present within the cavernous internal carotid arteries bilaterally without significant stenosis through the ICA termini. The A1 and M1 segments are normal. The MCA bifurcations are intact. There is some attenuation of distal MCA branch vessels, worse on the right.  Posterior  circulation: The vertebral arteries are codominant. PICA origins are visualized and normal bilaterally. A fetal type left posterior cerebral artery is present. The right posterior cerebral artery originates from the basilar tip.  Venous sinuses: The dural sinuses are patent. The straight sinus and deep cerebral veins are intact. Cortical veins are within normal limits.  Anatomic variants: Fetal type left posterior cerebral artery.  Delayed phase: The postcontrast images demonstrate no pathologic enhancement.  IMPRESSION: 1. White matter disease without evidence for acute cortical infarct. 2. Mild attenuation of MCA branch vessels, right greater than left. 3. No significant proximal stenosis, aneurysm, or branch vessel occlusion. 4. Mild atherosclerotic changes within the carotid bifurcations bilaterally without significant stenosis. 5. Degenerative changes in the cervical spine.   Electronically Signed   By: Marin Roberts M.D.   On: 01/04/2015 21:33   Mr Brain Wo Contrast  01/04/2015   CLINICAL DATA:  Acute  onset of intermittent aphasia. Episode of right hemiparesis.  EXAM: MRI HEAD WITHOUT CONTRAST  TECHNIQUE: Multiplanar, multiecho pulse sequences of the brain and surrounding structures were obtained without intravenous contrast.  COMPARISON:  CTA head and neck from the same day.  FINDINGS: The diffusion-weighted images demonstrate no evidence for acute or subacute infarction. Mild atrophy and white matter change is slightly advanced for age.  The ventricles are of normal size. A remote left temporal parietal lobe infarct is noted. No significant extraaxial fluid collection is present.  Flow is present in the major intracranial arteries. The globes and orbits are intact. Mild mucosal thickening is present in the anterior ethmoid air cells and frontal sinuses. A sequestered area in the superior left frontal sinus is opacified with osseous changes overlying this area, likely reactive.  The skullbase is  within normal limits. Midline structures are unremarkable.  IMPRESSION: 1. No acute intracranial abnormality. 2. Age advanced atrophy and white matter disease. 3. 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 4. Frontal sinus disease.   Electronically Signed   By: Marin Roberts M.D.   On: 01/04/2015 21:36    Microbiology: No results found for this or any previous visit (from the past 240 hour(s)).   Labs: Basic Metabolic Panel:  Recent Labs Lab 01/04/15 1752 01/04/15 1800 01/05/15 0645 01/06/15 1235 01/09/15 0339  NA 137 136 136  --  140  K 4.4 4.2 4.3  --  4.1  CL 99* 98* 100*  --  105  CO2  --  26 25  --  27  GLUCOSE 121* 121* 117*  --  103*  BUN 35* 24* 21*  --  19  CREATININE 1.40* 1.49* 1.29*  --  1.18  CALCIUM  --  9.5 9.1  --  8.8*  MG  --   --   --  2.0  --    Liver Function Tests:  Recent Labs Lab 01/04/15 1800 01/05/15 0645  AST 35 35  ALT 51 43  ALKPHOS 131* 119  BILITOT 1.2 1.2  PROT 7.4 7.1  ALBUMIN 3.8 3.4*   No results for input(s): LIPASE, AMYLASE in the last 168 hours. No results for input(s): AMMONIA in the last 168 hours. CBC:  Recent Labs Lab 01/04/15 1752 01/04/15 1800  WBC  --  7.0  NEUTROABS  --  4.4  HGB 19.0* 17.5*  HCT 56.0* 50.8  MCV  --  88.5  PLT  --  308   Cardiac Enzymes: No results for input(s): CKTOTAL, CKMB, CKMBINDEX, TROPONINI in the last 168 hours. BNP: BNP (last 3 results)  Recent Labs  01/06/15 1233  BNP 147.9*    ProBNP (last 3 results) No results for input(s): PROBNP in the last 8760 hours.  CBG:  Recent Labs Lab 01/04/15 1831  GLUCAP 119*       Signed:  VANN, JESSICA  [facility] 01/10/2015, 4:23 PM

## 2015-01-11 ENCOUNTER — Encounter (HOSPITAL_COMMUNITY): Payer: Self-pay | Admitting: Cardiovascular Disease

## 2015-01-11 NOTE — Progress Notes (Signed)
Patient discharged from hospital on 8.16.16. Medications unable to be wasted in pyxis. of fentanyl and  of midazolam wasted in the trash with Priscella Mann, RN as witness.  vial of Midazolam returned to main pharmacy because unable to return to pyxis.

## 2015-10-19 ENCOUNTER — Emergency Department (HOSPITAL_COMMUNITY): Payer: Managed Care, Other (non HMO)

## 2015-10-19 ENCOUNTER — Inpatient Hospital Stay (HOSPITAL_COMMUNITY)
Admission: EM | Admit: 2015-10-19 | Discharge: 2015-10-26 | DRG: 064 | Disposition: E | Payer: Managed Care, Other (non HMO) | Attending: Internal Medicine | Admitting: Internal Medicine

## 2015-10-19 ENCOUNTER — Encounter (HOSPITAL_COMMUNITY): Payer: Self-pay | Admitting: *Deleted

## 2015-10-19 DIAGNOSIS — Z515 Encounter for palliative care: Secondary | ICD-10-CM | POA: Diagnosis present

## 2015-10-19 DIAGNOSIS — I639 Cerebral infarction, unspecified: Secondary | ICD-10-CM | POA: Diagnosis present

## 2015-10-19 DIAGNOSIS — R29810 Facial weakness: Secondary | ICD-10-CM | POA: Diagnosis present

## 2015-10-19 DIAGNOSIS — I252 Old myocardial infarction: Secondary | ICD-10-CM

## 2015-10-19 DIAGNOSIS — Z823 Family history of stroke: Secondary | ICD-10-CM

## 2015-10-19 DIAGNOSIS — Z8249 Family history of ischemic heart disease and other diseases of the circulatory system: Secondary | ICD-10-CM

## 2015-10-19 DIAGNOSIS — I25709 Atherosclerosis of coronary artery bypass graft(s), unspecified, with unspecified angina pectoris: Secondary | ICD-10-CM | POA: Diagnosis not present

## 2015-10-19 DIAGNOSIS — I5022 Chronic systolic (congestive) heart failure: Secondary | ICD-10-CM | POA: Diagnosis present

## 2015-10-19 DIAGNOSIS — I509 Heart failure, unspecified: Secondary | ICD-10-CM

## 2015-10-19 DIAGNOSIS — I4510 Unspecified right bundle-branch block: Secondary | ICD-10-CM | POA: Diagnosis present

## 2015-10-19 DIAGNOSIS — I63319 Cerebral infarction due to thrombosis of unspecified middle cerebral artery: Secondary | ICD-10-CM

## 2015-10-19 DIAGNOSIS — I2510 Atherosclerotic heart disease of native coronary artery without angina pectoris: Secondary | ICD-10-CM | POA: Diagnosis present

## 2015-10-19 DIAGNOSIS — R4701 Aphasia: Secondary | ICD-10-CM | POA: Diagnosis present

## 2015-10-19 DIAGNOSIS — R569 Unspecified convulsions: Secondary | ICD-10-CM | POA: Diagnosis present

## 2015-10-19 DIAGNOSIS — I63412 Cerebral infarction due to embolism of left middle cerebral artery: Secondary | ICD-10-CM | POA: Diagnosis present

## 2015-10-19 DIAGNOSIS — R1310 Dysphagia, unspecified: Secondary | ICD-10-CM | POA: Diagnosis present

## 2015-10-19 DIAGNOSIS — I63512 Cerebral infarction due to unspecified occlusion or stenosis of left middle cerebral artery: Secondary | ICD-10-CM

## 2015-10-19 DIAGNOSIS — Z8673 Personal history of transient ischemic attack (TIA), and cerebral infarction without residual deficits: Secondary | ICD-10-CM | POA: Diagnosis not present

## 2015-10-19 DIAGNOSIS — Z7902 Long term (current) use of antithrombotics/antiplatelets: Secondary | ICD-10-CM

## 2015-10-19 DIAGNOSIS — Z951 Presence of aortocoronary bypass graft: Secondary | ICD-10-CM

## 2015-10-19 DIAGNOSIS — Z8674 Personal history of sudden cardiac arrest: Secondary | ICD-10-CM

## 2015-10-19 DIAGNOSIS — G8191 Hemiplegia, unspecified affecting right dominant side: Secondary | ICD-10-CM | POA: Diagnosis present

## 2015-10-19 DIAGNOSIS — N19 Unspecified kidney failure: Secondary | ICD-10-CM | POA: Diagnosis present

## 2015-10-19 DIAGNOSIS — I63312 Cerebral infarction due to thrombosis of left middle cerebral artery: Secondary | ICD-10-CM | POA: Diagnosis present

## 2015-10-19 DIAGNOSIS — G40909 Epilepsy, unspecified, not intractable, without status epilepticus: Secondary | ICD-10-CM

## 2015-10-19 DIAGNOSIS — Z66 Do not resuscitate: Secondary | ICD-10-CM | POA: Diagnosis present

## 2015-10-19 DIAGNOSIS — I63232 Cerebral infarction due to unspecified occlusion or stenosis of left carotid arteries: Secondary | ICD-10-CM | POA: Diagnosis not present

## 2015-10-19 DIAGNOSIS — G936 Cerebral edema: Secondary | ICD-10-CM | POA: Diagnosis not present

## 2015-10-19 DIAGNOSIS — I110 Hypertensive heart disease with heart failure: Secondary | ICD-10-CM | POA: Diagnosis present

## 2015-10-19 DIAGNOSIS — Z7982 Long term (current) use of aspirin: Secondary | ICD-10-CM

## 2015-10-19 DIAGNOSIS — R4182 Altered mental status, unspecified: Secondary | ICD-10-CM | POA: Diagnosis present

## 2015-10-19 DIAGNOSIS — E785 Hyperlipidemia, unspecified: Secondary | ICD-10-CM | POA: Diagnosis present

## 2015-10-19 DIAGNOSIS — I10 Essential (primary) hypertension: Secondary | ICD-10-CM | POA: Diagnosis present

## 2015-10-19 HISTORY — DX: Cerebral infarction, unspecified: I63.9

## 2015-10-19 HISTORY — DX: Gout, unspecified: M10.9

## 2015-10-19 LAB — COMPREHENSIVE METABOLIC PANEL
ALBUMIN: 3.8 g/dL (ref 3.5–5.0)
ALT: 26 U/L (ref 17–63)
ANION GAP: 11 (ref 5–15)
AST: 43 U/L — ABNORMAL HIGH (ref 15–41)
Alkaline Phosphatase: 65 U/L (ref 38–126)
BILIRUBIN TOTAL: 0.9 mg/dL (ref 0.3–1.2)
BUN: 12 mg/dL (ref 6–20)
CALCIUM: 9.1 mg/dL (ref 8.9–10.3)
CO2: 22 mmol/L (ref 22–32)
Chloride: 104 mmol/L (ref 101–111)
Creatinine, Ser: 0.98 mg/dL (ref 0.61–1.24)
Glucose, Bld: 108 mg/dL — ABNORMAL HIGH (ref 65–99)
POTASSIUM: 4.8 mmol/L (ref 3.5–5.1)
Sodium: 137 mmol/L (ref 135–145)
TOTAL PROTEIN: 6.6 g/dL (ref 6.5–8.1)

## 2015-10-19 LAB — I-STAT CHEM 8, ED
BUN: 13 mg/dL (ref 6–20)
CREATININE: 1 mg/dL (ref 0.61–1.24)
Calcium, Ion: 1.16 mmol/L (ref 1.12–1.23)
Chloride: 100 mmol/L — ABNORMAL LOW (ref 101–111)
Glucose, Bld: 114 mg/dL — ABNORMAL HIGH (ref 65–99)
HEMATOCRIT: 49 % (ref 39.0–52.0)
HEMOGLOBIN: 16.7 g/dL (ref 13.0–17.0)
Potassium: 4 mmol/L (ref 3.5–5.1)
SODIUM: 142 mmol/L (ref 135–145)
TCO2: 26 mmol/L (ref 0–100)

## 2015-10-19 LAB — CBC
HCT: 46.5 % (ref 39.0–52.0)
HEMOGLOBIN: 16 g/dL (ref 13.0–17.0)
MCH: 29.4 pg (ref 26.0–34.0)
MCHC: 34.4 g/dL (ref 30.0–36.0)
MCV: 85.3 fL (ref 78.0–100.0)
Platelets: 254 10*3/uL (ref 150–400)
RBC: 5.45 MIL/uL (ref 4.22–5.81)
RDW: 12.6 % (ref 11.5–15.5)
WBC: 10.5 10*3/uL (ref 4.0–10.5)

## 2015-10-19 LAB — I-STAT TROPONIN, ED: Troponin i, poc: 0 ng/mL (ref 0.00–0.08)

## 2015-10-19 LAB — DIFFERENTIAL
Basophils Absolute: 0 10*3/uL (ref 0.0–0.1)
Basophils Relative: 0 %
EOS ABS: 0 10*3/uL (ref 0.0–0.7)
EOS PCT: 0 %
LYMPHS ABS: 1.1 10*3/uL (ref 0.7–4.0)
Lymphocytes Relative: 10 %
MONO ABS: 0.7 10*3/uL (ref 0.1–1.0)
Monocytes Relative: 7 %
NEUTROS PCT: 83 %
Neutro Abs: 8.8 10*3/uL — ABNORMAL HIGH (ref 1.7–7.7)

## 2015-10-19 LAB — URINALYSIS, ROUTINE W REFLEX MICROSCOPIC
Bilirubin Urine: NEGATIVE
Glucose, UA: NEGATIVE mg/dL
Hgb urine dipstick: NEGATIVE
Ketones, ur: NEGATIVE mg/dL
LEUKOCYTES UA: NEGATIVE
NITRITE: NEGATIVE
PH: 6.5 (ref 5.0–8.0)
Protein, ur: NEGATIVE mg/dL
SPECIFIC GRAVITY, URINE: 1.039 — AB (ref 1.005–1.030)

## 2015-10-19 LAB — APTT: aPTT: 25 seconds (ref 24–37)

## 2015-10-19 LAB — ETHANOL: Alcohol, Ethyl (B): <5 mg/dL (ref <5)

## 2015-10-19 LAB — RAPID URINE DRUG SCREEN, HOSP PERFORMED
AMPHETAMINES: NOT DETECTED
Barbiturates: NOT DETECTED
Benzodiazepines: NOT DETECTED
Cocaine: NOT DETECTED
OPIATES: NOT DETECTED
Tetrahydrocannabinol: NOT DETECTED

## 2015-10-19 LAB — PROTIME-INR
INR: 1.07 (ref 0.00–1.49)
PROTHROMBIN TIME: 14.1 s (ref 11.6–15.2)

## 2015-10-19 LAB — MRSA PCR SCREENING: MRSA by PCR: NEGATIVE

## 2015-10-19 MED ORDER — STROKE: EARLY STAGES OF RECOVERY BOOK
Freq: Once | Status: DC
  Filled 2015-10-19: qty 1

## 2015-10-19 MED ORDER — LEVETIRACETAM 500 MG PO TABS: 500.0000 mg | ORAL_TABLET | Freq: Two times a day (BID) | ORAL | Status: DC

## 2015-10-19 MED ORDER — PANTOPRAZOLE SODIUM 40 MG IV SOLR: 40.0000 mg | Freq: Once | INTRAVENOUS | Status: DC

## 2015-10-19 MED ORDER — ACETAMINOPHEN 650 MG RE SUPP: 650.0000 mg | RECTAL | Status: DC | PRN

## 2015-10-19 MED ORDER — SODIUM CHLORIDE 0.9 % IV SOLN
8.0000 mg | Freq: Four times a day (QID) | INTRAVENOUS | Status: DC | PRN
  Filled 2015-10-19 (×2): qty 4

## 2015-10-19 MED ORDER — ATORVASTATIN CALCIUM 80 MG PO TABS: 80.0000 mg | ORAL_TABLET | Freq: Every day | ORAL | Status: DC

## 2015-10-19 MED ORDER — SODIUM CHLORIDE 0.9 % IV SOLN
500.0000 mg | Freq: Two times a day (BID) | INTRAVENOUS | Status: DC
  Administered 2015-10-19 – 2015-10-20 (×3): 500 mg via INTRAVENOUS
  Filled 2015-10-19 (×6): qty 5

## 2015-10-19 MED ORDER — SODIUM CHLORIDE 0.9 % IV SOLN
INTRAVENOUS | Status: AC
  Administered 2015-10-19: 16:00:00 via INTRAVENOUS

## 2015-10-19 MED ORDER — IOPAMIDOL (ISOVUE-370) INJECTION 76%
INTRAVENOUS | Status: AC
  Administered 2015-10-19: 90 mL
  Filled 2015-10-19: qty 100

## 2015-10-19 MED ORDER — ACETAMINOPHEN 325 MG PO TABS: 650.0000 mg | ORAL_TABLET | ORAL | Status: DC | PRN

## 2015-10-19 NOTE — ED Provider Notes (Addendum)
CSN: [id number]     Arrival date & time 10/08/2015  1150 History   First MD Initiated Contact with Patient 10/08/2015 1157     Chief Complaint  Patient presents with  . Altered Mental Status     (Consider location/radiation/quality/duration/timing/severity/associated sxs/prior Treatment) HPI Comments: Patient is a 55 year old male with a history of coronary artery disease status post CABG, prior TIA and hypertension presenting today by EMS for altered mental status and flaccid right-sided paralysis. Patient was last seen normal at 10 PM last night. Wife attempted to text him this morning at 9 AM and did not get a response. Housekeeping found patient on the floor face down this morning. Normally patient has no unilateral weakness or numbness. [place].  No further history available at this time.  Patient is a 55 y.o. male presenting with altered mental status. The history is provided by the EMS personnel. The history is limited by the absence of a caregiver and the condition of the patient.  Altered Mental Status   Past Medical History  Diagnosis Date  . Coronary artery disease   . Cardiac arrest (HCC)   . Heart attack   . Hypertension    Past Surgical History  Procedure Laterality Date  . Bypass graft    . Cardiac catheterization N/A 01/09/2015    Procedure: Left Heart Cath and Cors/Grafts Angiography;  Surgeon: Kathleene Hazelhristopher D McAlhany, MD;  [facility];  Service: Cardiovascular;  Laterality: N/A;  . Tee without cardioversion N/A 01/10/2015    Procedure: TRANSESOPHAGEAL ECHOCARDIOGRAM (TEE);  Surgeon: Chilton Siiffany Goodell, MD;  [facility];  Service: Cardiovascular;  Laterality: N/A;   Family History  Problem Relation Age of Onset  . Stroke Mother   . Heart attack Father   . Kidney failure Brother    Social History  Substance Use Topics  . Smoking status: Never Smoker   . Smokeless tobacco: Not on file  . Alcohol Use:  No    Review of Systems  All other systems reviewed and are negative.     Allergies  Review of patient's allergies indicates no known allergies.  Home Medications   Prior to Admission medications   Medication Sig Start Date End Date Taking? Authorizing Provider  aspirin EC 325 MG EC tablet Take 1 tablet (325 mg total) by mouth daily. 01/09/15   Christiane Haorinna L Sullivan, MD  atorvastatin (LIPITOR) 80 MG tablet Take 1 tablet (80 mg total) by mouth daily at 6 PM. 01/10/15   Joseph ArtJessica U Vann, DO  carvedilol (COREG) 3.125 MG tablet Take 1 tablet (3.125 mg total) by mouth 2 (two) times daily with a meal. 01/10/15   Joseph ArtJessica U Vann, DO  clopidogrel (PLAVIX) 75 MG tablet Take 1 tablet (75 mg total) by mouth daily. 01/10/15   Joseph ArtJessica U Vann, DO  levETIRAcetam (KEPPRA) 500 MG tablet Take 1 tablet (500 mg total) by mouth 2 (two) times daily. 01/10/15   Joseph ArtJessica U Vann, DO  lisinopril (PRINIVIL,ZESTRIL) 20 MG tablet Take 1 tablet (20 mg total) by mouth daily. 01/10/15   Joseph ArtJessica U Vann, DO   There were no vitals taken for this visit. Physical Exam  Constitutional: He appears well-developed and well-nourished. No distress.  HENT:  Head: Normocephalic and atraumatic.  Mouth/Throat: Oropharynx is clear and moist.  Eyes: Conjunctivae and EOM are normal. Pupils are equal, round, and reactive to light.  Neck: Normal range of motion. Neck supple.  Cardiovascular: Normal rate, regular rhythm and  intact distal pulses.   No murmur heard. Pulmonary/Chest: Effort normal and breath sounds normal. No respiratory distress. He has no wheezes. He has no rales.  Well-healed midline sternotomy scar  Abdominal: Soft. He exhibits no distension. There is no tenderness. There is no rebound and no guarding.  Musculoskeletal: Normal range of motion. He exhibits tenderness. He exhibits no edema.  Contusion to the right deltoid.  Right foot swelling and abrasion.  Left shin abrasion  Neurological: He is alert.  No facial droop but  pt refuses to speak. Unable to tell if pt is aphasic.  Flaccid paralysis of the right upper ext and decreased sensation.  Right lower ext with 1/5 strength.  Mild quadricep flexing with pain.  Skin: Skin is warm and dry. No rash noted. No erythema.  Psychiatric: He has a normal mood and affect. His behavior is normal.  Nursing note and vitals reviewed.   ED Course  Procedures (including critical care time) Labs Review Labs Reviewed  DIFFERENTIAL - Abnormal; Notable for the following:    Neutro Abs 8.8 (*)    All other components within normal limits  COMPREHENSIVE METABOLIC PANEL - Abnormal; Notable for the following:    Glucose, Bld 108 (*)    AST 43 (*)    All other components within normal limits  I-STAT CHEM 8, ED - Abnormal; Notable for the following:    Chloride 100 (*)    Glucose, Bld 114 (*)    All other components within normal limits  ETHANOL  PROTIME-INR  APTT  CBC  URINE RAPID DRUG SCREEN, HOSP PERFORMED  URINALYSIS, ROUTINE W REFLEX MICROSCOPIC (NOT AT [facility])  Rosezena Sensor, ED    Imaging Review Dg Chest Port 1 View  2015/10/25  CLINICAL DATA:  Found unresponsive. Right-sided weakness. History of stroke. EXAM: PORTABLE CHEST 1 VIEW COMPARISON:  03/2015. FINDINGS: 1208 hours. Two views were obtained. There are low lung volumes with mild resulting atelectasis at both lung bases, greater on the second view. There is no confluent airspace opacity, edema or pleural effusion. The heart size is stable status post CABG. No acute osseous findings are evident. IMPRESSION: Low lung volumes with mild bibasilar atelectasis. No focal airspace disease or other significant change. Electronically Signed   By: Carey Bullocks M.D.   On: Oct 25, 2015 12:23   Dg Foot Complete Right  Oct 25, 2015  CLINICAL DATA:  Soft tissue swelling of the right foot. Patient unresponsive. EXAM: RIGHT FOOT COMPLETE - 3+ VIEW COMPARISON:  None. FINDINGS: There is no evidence of fracture or dislocation. No  evidence of erosive changes to suggest osteomyelitis. 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 Soft tissue swelling is noted at the forefoot. IMPRESSION: No acute fracture or dislocation identified about the right foot. Electronically Signed   By: Ted Mcalpine M.D.   On: 2015/10/25 12:47   I have personally reviewed and evaluated these images and lab results as part of my medical decision-making.   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 Patient has abrasions and contusion to the right side of his body where it looks like he was attempting to get off the floor. He currently is refusing to speak so is unclear if he has any aphasia. However he can follow instructions appropriately. There is no evidence of injury to his head. Has some minimal right marks to the right side of his tongue and does take Keppra which is also concerning the patient may have had a stroke versus intracranial bleed versus seizure. There is been no seizure activity on exam. Patient does take Plavix. Patient does not meet code stroke criteria because he was last seen normal at 10 PM. However stroke protocol initiated.  2:06 PM Discussed with neurology patient sent for wakeup stroke protocol with perfusion scan. Patient found to have extreme MCA infarct with intense deficits and minimal perfusion. Patient at this point is not a candidate  for TPA or directed thrombolysis. Spoke with the patient's wife who states he would not want any aggressive measures done. She did read a rate that he is a DO NOT RESUSCITATE.  Gwyneth Sprout, MD 10/02/2015 1407  Gwyneth Sprout, MD 10/23/2015 1433

## 2015-10-19 NOTE — H&P (Signed)
Date: 09/30/2015               Patient Name:  Darrell Knapp MRN: [id number]  DOB: 1961/03/08 Age / Sex: 55 y.o., male   PCP: No primary care provider on file.         Medical Service: Internal Medicine Teaching Service         Attending Physician: Dr. Inez CatalinaEmily B Mullen, MD    First Contact: Dr. Gwynn BurlyAndrew Vaneza Pickart Pager: [phone]  Second Contact: Dr. Hyacinth Meekerasrif Ahmed Pager: [phone]       After Hours (After 5p/  First Contact Pager: [phone]  weekends / holidays): Second Contact Pager: [phone]   Chief Complaint: Stroke, AMS  History of Present Illness: Darrell Knapp is a 55 y.o. man with past medical history of CAD s/p CABG x 3, HFrEF, prior TIA, seizure history, hypertension who presented to the ED with stroke. He his currently aphasic and history was obtained via chart review and from the referring provider and ED RN.  Patient is apparently visiting [place] from [place] on business and was found unresponsive by housekeeping this morning at his hotel.  Last know normal was around 10pm last night.  He was brought to the ED where he was found to have right-sided paralysis and right-sided facial droop.  Imaging showed a large left MCA territory infarct.  He was taken for CT perfusion study which showed that he had already completed a large infarction.  Per notes from neurology who spoke with patients wife, she was very clear that patients wishes are that he would not want life-sustaining measures if he were to have significant deficits.    Meds: Current Facility-Administered Medications  Medication Dose Route Frequency Provider Last Rate Last Dose  .  stroke: mapping our early stages of recovery book   Does not apply Once Tasrif Ahmed, MD      . 0.9 %  sodium chloride infusion   Intravenous STAT Gwyneth SproutWhitney Plunkett, MD 125 mL/hr at 10/15/2015 1617    . acetaminophen (TYLENOL) tablet 650 mg  650 mg Oral Q4H PRN Tasrif Ahmed, MD       Or  . acetaminophen (TYLENOL) suppository 650 mg  650 mg Rectal Q4H PRN  Tasrif Ahmed, MD      . atorvastatin (LIPITOR) tablet 80 mg  80 mg Oral q1800 Tasrif Ahmed, MD      . levETIRAcetam (KEPPRA) tablet 500 mg  500 mg Oral BID Tasrif Ahmed, MD      . ondansetron (ZOFRAN) 8 mg in sodium chloride 0.9 % 50 mL IVPB  8 mg Intravenous Q6H PRN Tasrif Ahmed, MD      . pantoprazole (PROTONIX) injection 40 mg  40 mg Intravenous Once Hyacinth Meekerasrif Ahmed, MD       Current Outpatient Prescriptions  Medication Sig Dispense Refill  . aspirin EC 325 MG EC tablet Take 1 tablet (325 mg total) by mouth daily. 30 tablet 0  . atorvastatin (LIPITOR) 80 MG tablet Take 1 tablet (80 mg total) by mouth daily at 6 PM. 30 tablet 0  . carvedilol (COREG) 3.125 MG tablet Take 1 tablet (3.125 mg total) by mouth 2 (two) times daily with a meal. 60 tablet 0  . clopidogrel (PLAVIX) 75 MG tablet Take 1 tablet (75 mg total) by mouth daily. 30 tablet 0  . levETIRAcetam (KEPPRA) 500 MG tablet Take 1 tablet (500 mg total) by mouth 2 (two) times daily. 60 tablet 0  . lisinopril (PRINIVIL,ZESTRIL) 20 MG tablet Take 1 tablet (  20 mg total) by mouth daily. 30 tablet 0    Allergies: Allergies as of Oct 20, 2015  . (No Known Allergies)   Past Medical History  Diagnosis Date  . Coronary artery disease   . Cardiac arrest (HCC)   . Heart attack (HCC)   . Hypertension   . Stroke (HCC)   . Gout    Past Surgical History  Procedure Laterality Date  . Bypass graft    . Cardiac catheterization N/A 01/09/2015    Procedure: Left Heart Cath and Cors/Grafts Angiography;  Surgeon: Kathleene Hazel, MD;  Location: [facility] INVASIVE CV LAB;  Service: Cardiovascular;  Laterality: N/A;  . Tee without cardioversion N/A 01/10/2015    Procedure: TRANSESOPHAGEAL ECHOCARDIOGRAM (TEE);  Surgeon: Chilton Si, MD;  Location[facility] ENDOSCOPY;  Service: Cardiovascular;  Laterality: N/A;   Family History  Problem Relation Age of Onset  . Stroke Mother   . Heart attack Father   . Kidney failure Brother    Social History    Social History  . Marital Status: Married    Spouse Name: N/A  . Number of Children: N/A  . Years of Education: N/A   Occupational History  . Not on file.   Social History Main Topics  . Smoking status: Never Smoker   . Smokeless tobacco: Not on file  . Alcohol Use: No  . Drug Use: Not on file  . Sexual Activity: Not Currently    Birth Control/ Protection: None   Other Topics Concern  . Not on file   Social History Narrative    Review of Systems: Review of systems not obtained due to patient factors.  Physical Exam: Blood pressure 175/100, pulse 96, temperature 97.8 F (36.6 C), temperature source Oral, resp. rate 22, height 5\' 10"  (1.778 m), weight 250 lb (113.399 kg), SpO2 99 %. Physical Exam  Constitutional: He is well-developed, well-nourished, and in no distress.  HENT:  Head: Normocephalic and atraumatic.  Eyes: Conjunctivae are normal. Pupils are equal, round, and reactive to light.  Cardiovascular: Normal rate, regular rhythm and normal heart sounds.   Pulmonary/Chest: Effort normal and breath sounds normal.  Abdominal: Soft. Bowel sounds are normal. There is no tenderness.  Musculoskeletal: He exhibits no edema.  Neurological:  Alert and appears to understand what is said.  Follows some simple commands but has right sided neglect. Cranial Nerve Exam: Right sided facial droop is present. Left-sided gaze preference, PERRL Unable to speak, will not stick tongue out Strength LUE: 5/5, RUE 0/5, has difficulty following lower extremity strength testing. Does not withdraw from pain on right, but does on left  Skin:  Abrasions to lower extremities     Lab results: Basic Metabolic Panel:  Recent Labs  16/10/96 1258 Oct 20, 2015 1306  NA 137 142  K 4.8 4.0  CL 104 100*  CO2 22  --   GLUCOSE 108* 114*  BUN 12 13  CREATININE 0.98 1.00  CALCIUM 9.1  --    Liver Function Tests:  Recent Labs  20-Oct-2015 1258  AST 43*  ALT 26  ALKPHOS 65  BILITOT 0.9   PROT 6.6  ALBUMIN 3.8   CBC:  Recent Labs  10/20/15 1258 Oct 20, 2015 1306  WBC 10.5  --   NEUTROABS 8.8*  --   HGB 16.0 16.7  HCT 46.5 49.0  MCV 85.3  --   PLT 254  --    Coagulation:  Recent Labs  2015/10/20 1258  LABPROT 14.1  INR 1.07   Urine Drug Screen:  Drugs of Abuse     Component Value Date/Time   LABOPIA NONE DETECTED 01/05/2015 1435   COCAINSCRNUR NONE DETECTED 01/05/2015 1435   LABBENZ NONE DETECTED 01/05/2015 1435   AMPHETMU NONE DETECTED 01/05/2015 1435   THCU NONE DETECTED 01/05/2015 1435   LABBARB NONE DETECTED 01/05/2015 1435    Alcohol Level:  Recent Labs  10/24/2015 1258  ETH <5   Imaging results:  Ct Angio Head W/cm &/or Wo Cm  10/12/2015  CLINICAL DATA:  Right-sided weakness.  Wake up stroke. EXAM: CT ANGIOGRAPHY HEAD AND NECK TECHNIQUE: Multidetector CT imaging of the head and neck was performed using the standard protocol during bolus administration of intravenous contrast. Multiplanar CT image reconstructions and MIPs were obtained to evaluate the vascular anatomy. Carotid stenosis measurements (when applicable) are obtained utilizing NASCET criteria, using the distal internal carotid diameter as the denominator. CONTRAST:  90 mL Isovue 370 IV COMPARISON:  None. FINDINGS: CTA NECK Aortic arch: Normal aortic arch. Proximal great vessels patent. Lung apices clear. Right carotid system: Right common carotid artery patent. Atherosclerotic calcification the carotid bifurcation without significant stenosis. Right internal carotid artery widely patent. Right external carotid artery widely patent. Left carotid system: Left common carotid artery widely patent. Atherosclerotic calcification at the bifurcation. 50% diameter stenosis proximal left internal carotid artery. Small caliber left internal carotid artery due to decreased outflow from distal occlusion. Vertebral arteries:Both vertebral arteries patent to the basilar without significant stenosis. Skeleton:  Mild disc degeneration in the cervical spine. No fracture or bone lesion. Other neck: Negative for adenopathy. CTA HEAD Anterior circulation: Right cavernous carotid widely patent. Right anterior and middle cerebral arteries widely patent. Left anterior cerebral artery is patent and appears to be supplied from the right side. Small caliber left cavernous carotid which occludes in the supraclinoid segment. Left middle cerebral artery is occluded with minimal filling of left MCA branches from collaterals. Left A1 segment is patent due to retrograde flow Posterior circulation: Both vertebral arteries patent to the basilar without significant stenosis. PICA patent bilaterally. Basilar widely patent. AICA, superior cerebellar, and posterior cerebral arteries widely patent. Venous sinuses: Patent Anatomic variants: None Delayed phase: Not performed IMPRESSION: Large territory acute infarct left MCA territory without hemorrhage. 50% diameter stenosis proximal left internal carotid artery. 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 M1 and most of the left MCA branches are occluded. Left anterior cerebral artery supplied from the right. No significant right carotid stenosis. Both vertebral arteries patent. I discussed the findings with Dr. Amada Jupiter at 1330 hours on 10/18/2015. Electronically Signed   By: Marlan Palau M.D.   On: 10/07/2015 14:47   Ct Head Wo Contrast  10/07/2015  CLINICAL DATA:  Right-sided weakness.  Wake up stroke. EXAM: CT HEAD WITHOUT CONTRAST TECHNIQUE: Contiguous axial images were obtained from the base of the skull through the vertex without intravenous contrast. COMPARISON:  MRI 01/04/2015 FINDINGS: Low density throughout the left MCA territory compatible with acute infarct. This involves most of the temporal lobe as well as most of the parietal lobe. Left frontal temporal lobe also involved. There is some involvement of the insula and lateral  basal ganglia. Negative for hemorrhage.  No shift of the midline structures. Ventricle size normal.  No other acute infarct.  No mass lesion. Negative calvarium. IMPRESSION: Large territory acute infarct left MCA territory. Negative for hemorrhage. Electronically Signed   By: Marlan Palau M.D.   On: 10/12/2015 14:34   Ct Angio Neck  W/cm &/or Wo/cm  November 17, 2015  CLINICAL DATA:  Right-sided weakness.  Wake up stroke. EXAM: CT ANGIOGRAPHY HEAD AND NECK TECHNIQUE: Multidetector CT imaging of the head and neck was performed using the standard protocol during bolus administration of intravenous contrast. Multiplanar CT image reconstructions and MIPs were obtained to evaluate the vascular anatomy. Carotid stenosis measurements (when applicable) are obtained utilizing NASCET criteria, using the distal internal carotid diameter as the denominator. CONTRAST:  90 mL Isovue 370 IV COMPARISON:  None. FINDINGS: CTA NECK Aortic arch: Normal aortic arch. Proximal great vessels patent. Lung apices clear. Right carotid system: Right common carotid artery patent. Atherosclerotic calcification the carotid bifurcation without significant stenosis. Right internal carotid artery widely patent. Right external carotid artery widely patent. Left carotid system: Left common carotid artery widely patent. Atherosclerotic calcification at the bifurcation. 50% diameter stenosis proximal left internal carotid artery. Small caliber left internal carotid artery due to decreased outflow from distal occlusion. Vertebral arteries:Both vertebral arteries patent to the basilar without significant stenosis. Skeleton: Mild disc degeneration in the cervical spine. No fracture or bone lesion. Other neck: Negative for adenopathy. CTA HEAD Anterior circulation: Right cavernous carotid widely patent. Right anterior and middle cerebral arteries widely patent. Left anterior cerebral artery is patent and appears to be supplied from the right side. Small  caliber left cavernous carotid which occludes in the supraclinoid segment. Left middle cerebral artery is occluded with minimal filling of left MCA branches from collaterals. Left A1 segment is patent due to retrograde flow Posterior circulation: Both vertebral arteries patent to the basilar without significant stenosis. PICA patent bilaterally. Basilar widely patent. AICA, superior cerebellar, and posterior cerebral arteries widely patent. Venous sinuses: Patent Anatomic variants: None Delayed phase: Not performed IMPRESSION: Large territory acute infarct left MCA territory without hemorrhage. 50% diameter stenosis proximal left internal carotid artery. 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 M1 and most of the left MCA branches are occluded. Left anterior cerebral artery supplied from the right. No significant right carotid stenosis. Both vertebral arteries patent. I discussed the findings with Dr. Amada Jupiter at 1330 hours on 11/17/15. Electronically Signed   By: Marlan Palau M.D.   On: 2015/11/17 14:47   Ct Cerebral Perfusion W/cm  November 17, 2015  CLINICAL DATA:  Wake up stroke.  Right-sided weakness. EXAM: CT CEREBRAL PERFUSION WITH CONTRAST TECHNIQUE: CT perfusion performed following intravenous injection of contrast. CONTRAST:  90 mL Isovue 370 IV for CTA and CT perfusion. COMPARISON:  CT head today FINDINGS: Large territory infarct in the left MCA territory. This area shows diffusely decreased cerebral blood volume. There is partial sparing of the basal ganglia. This area also shows diffusely elevated mean transit time. There may be a small amount of penumbra around the margin of the infarct but the majority of the area shows infarcted brain. IMPRESSION: Large territory infarct of the MCA territory. Possible small amount of penumbra. Electronically Signed   By: Marlan Palau M.D.   On: 11/17/15 14:56   Dg Chest Port 1 View  11-17-15  CLINICAL  DATA:  Found unresponsive. Right-sided weakness. History of stroke. EXAM: PORTABLE CHEST 1 VIEW COMPARISON:  03/2015. FINDINGS: 1208 hours. Two views were obtained. There are low lung volumes with mild resulting atelectasis at both lung bases, greater on the second view. There is no confluent airspace opacity, edema or pleural effusion. The heart size is stable status post CABG. No acute osseous findings are evident. IMPRESSION: Low lung volumes with mild bibasilar  atelectasis. No focal airspace disease or other significant change. Electronically Signed   By: Carey Bullocks M.D.   On: 10/27/15 12:23   Dg Foot Complete Right  October 27, 2015  CLINICAL DATA:  Soft tissue swelling of the right foot. Patient unresponsive. EXAM: RIGHT FOOT COMPLETE - 3+ VIEW COMPARISON:  None. FINDINGS: There is no evidence of fracture or dislocation. No evidence of erosive changes to suggest osteomyelitis. 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 Soft tissue swelling is noted at the forefoot. IMPRESSION: No acute fracture or dislocation identified about the right foot. Electronically Signed   By: Ted Mcalpine M.D.   On: 10/27/15 12:47    Other results: EKG: Sinus rhythm. Right bundle branch block. Anterior infarct. No significant change since last tracing   Assessment & Plan by Problem: Active Problems:   Hypertension   CAD (coronary artery disease)   Renal failure   Aphasia   HLD (hyperlipidemia)   CVA (cerebral infarction)  Large MCA Stroke: patient presented from hotel after being found by housekeeping staff.  He is in [place] on business and has risk factors including HTN, CAD s/p CABG, prior TIA.  Imaging as above shows a large territory acute infarct of the left MCA territory. Negative for hemorrhage.  Neurology was consulted in the ED and patients wife reports that patient would not want any life-sustaining measures.  Due to the size of the stroke there is concern for  edema and swelling associated with this.  Patients wife is currently en route to [place] from [place], expected to arrive this evening around 9pm.  Will continue with stroke work-up at this time - check lipid panel - check A1c - check U/A, UDS - plan to start anti-platelet therapy within 48 hours of onset if no evidence of hemorrhagic transformation - morning CBC, CMET - telemetry - permissive HTN - SLP eval - after discussion with wife, will decide on PT/OT eval  History of CAD s/p CABG: - hold plavix and ASA - continue statin  History of HFrEF - holding ACE and BB, continuing statin  History of Seizure - continue Keppra  Diet: NPO  DVT PPx: SCD  CODE: DNR  Dispo: Disposition is deferred at this time, awaiting improvement of current medical problems.    The patient does not have a current PCP (No primary care provider on file.) and does not need an [facility] hospital follow-up appointment after discharge.  The patient does have transportation limitations that hinder transportation to clinic appointments.  Signed: Gwynn Burly, DO 10-27-15, 6:02 PM

## 2015-10-19 NOTE — Progress Notes (Signed)
Patient had wallet with 11 credit cards, driver licsense, and hotel room key card. Also 1 black Gar[employer] watch. Belongings taken and locked up by security. Wife can pick up whenever she arrives. Yellow pick-up sheet in patient chart. Luther Parodyaitlin Lum KeasFoecking,RN

## 2015-10-19 NOTE — ED Notes (Signed)
Attempted to call report x 1  

## 2015-10-19 NOTE — Consult Note (Signed)
Neurology Consultation Reason for Consult: Stroke Referring Physician: Anitra LauthPlunkett, W  CC: Stroke  History is obtained from: Referring provider  HPI: Darrell Knapp is a 55 y.o. male with a history of heart attack requiring coronary bypass here in the past as well as stroke who is in town from business from [place]. He was found by and housekeeping at his hotel and had become lodged between 2 mattresses. He was brought into the emergency department where was recognized that he had signs consistent with a large left MCA infarct and therefore neurology was consulted. Given an unknown onset of symptoms he was taken for stat CT perfusion which unfortunately showed that he had already completed a very large infarction.  I discussed with his wife at length that he had a very large stroke that I was concerned that he may have edema and swelling associated with this. She was very clear that his previous wishes are that he would want no life sustaining measures if he were to have significant deficits   LKW: 10 PM yesterday tpa given?: no, outside of window   ROS:   Unable to obtain due to altered mental status.   Past Medical History  Diagnosis Date  . Coronary artery disease   . Cardiac arrest (HCC)   . Heart attack (HCC)   . Hypertension   . Stroke (HCC)   . Gout      Family History  Problem Relation Age of Onset  . Stroke Mother   . Heart attack Father   . Kidney failure Brother      Social History:  reports that he has never smoked. He does not have any smokeless tobacco history on file. He reports that he does not drink alcohol. His drug history is not on file.   Exam: Current vital signs: Temp(Src) 97.8 F (36.6 C) (Oral)  Ht 5\' 10"  (1.778 m)  Wt 113.399 kg (250 lb)  BMI 35.87 kg/m2 Vital signs in last 24 hours: Temp:  [97.8 F (36.6 C)] 97.8 F (36.6 C) (05/25 1210) Weight:  [113.399 kg (250 lb)] 113.399 kg (250 lb) (05/25 1202)   Physical Exam  Constitutional: Appears  well-developed and well-nourished.  Psych: Affect appropriate to situation Eyes: No scleral injection HENT: No OP obstrucion Head: Normocephalic.  Cardiovascular: Normal rate and regular rhythm.  Respiratory: Effort normal and breath sounds normal to anterior ascultation GI: Soft.  No distension. There is no tenderness.  Skin: WDI  Neuro: Mental Status: Patient is awake, alert, He appears to understand much of what is said, he follows some commands briskly. He has difficulty with even slightly complex commands. Cranial Nerves: II: Right hemianopia Pupils are equal, round, and reactive to light.   III,IV, VI: Left gaze preference, cannot cross midline to the right V: Facial sensation is decreased on right VII: Facial movement is decreased on right VIII: hearing is intact to voice Motor: Tone is normal. Bulk is normal. 5/5 strength was present on the left side, he has a dense plegia of both the right arm and leg, triple flexion in the right leg Sensory: Sensation is decreased to light touch on the right Plantars: Downgoing on left, triple flexion on the right Cerebellar: Does not perform, no clear ataxia on the left   I have reviewed labs in epic and the results pertinent to this consultation are: [id number]-unremarkable  I have reviewed the images obtained: CT head-large left MCA infarct essentially involving the entire MCA distribution, ACA territory appears spared. CTA it appears  the ACAs filling from the circle of Willis, but there is a distal ICA/MCA occlusion  Impression: 55 year old male who is unfortunate and has suffered a very large MCA stroke. I suspect that there is one be significant edema and swelling, however after discussion with his wife he has made it very clear that he would not want life prolonging therapies including hemicraniectomy. In fact he was DNR/DNI even prior to this event. I discussed less aggressive measures for control his edema such as ICU admission with 3%  saline, but the wife stated that this is not something that he would want to pursue.  She is on a flight from [place], and should be here this evening.  I think it is possible that he might not have as much swelling as I expect, and there is a chance that he could survive this stroke even without intervention. I did inform the wife that this was a possibility. He will almost certainly have severe lasting deficits in this case.  Recommendations: 1) repeat CT head in the morning 2) frequent neuro checks 3) lipid panel, A1c 4) echocardiogram 5) Dopplers are not needed given that we have CT angiogram 6) stroke team to follow in the morning.   Ritta Slot, MD [facility] [phone]  If 7pm- 7am, please page neurology on call as listed in AMION.

## 2015-10-19 NOTE — ED Notes (Signed)
[facility] [place].  Pt found by housekeeping this morning, lying face down between 2 beds.  R sided deficits.  cbg 120.  Vs 115/80, rr24, 100%ra, hr 75.  Hx of stroke and cardiac bypass surgery.  Wife, Alinda SierrasCindy Beneke, notified [phone]).  [place] here on business trip from [place].

## 2015-10-20 ENCOUNTER — Inpatient Hospital Stay (HOSPITAL_COMMUNITY): Payer: Managed Care, Other (non HMO)

## 2015-10-20 DIAGNOSIS — Z515 Encounter for palliative care: Secondary | ICD-10-CM

## 2015-10-20 DIAGNOSIS — I63232 Cerebral infarction due to unspecified occlusion or stenosis of left carotid arteries: Secondary | ICD-10-CM

## 2015-10-20 DIAGNOSIS — I63312 Cerebral infarction due to thrombosis of left middle cerebral artery: Principal | ICD-10-CM

## 2015-10-20 DIAGNOSIS — E785 Hyperlipidemia, unspecified: Secondary | ICD-10-CM

## 2015-10-20 DIAGNOSIS — R4701 Aphasia: Secondary | ICD-10-CM

## 2015-10-20 DIAGNOSIS — I25709 Atherosclerosis of coronary artery bypass graft(s), unspecified, with unspecified angina pectoris: Secondary | ICD-10-CM

## 2015-10-20 LAB — CBC
HEMATOCRIT: 43.4 % (ref 39.0–52.0)
HEMOGLOBIN: 14.9 g/dL (ref 13.0–17.0)
MCH: 29.7 pg (ref 26.0–34.0)
MCHC: 34.3 g/dL (ref 30.0–36.0)
MCV: 86.6 fL (ref 78.0–100.0)
Platelets: 261 10*3/uL (ref 150–400)
RBC: 5.01 MIL/uL (ref 4.22–5.81)
RDW: 12.6 % (ref 11.5–15.5)
WBC: 12.1 10*3/uL — AB (ref 4.0–10.5)

## 2015-10-20 LAB — COMPREHENSIVE METABOLIC PANEL
ALK PHOS: 61 U/L (ref 38–126)
ALT: 23 U/L (ref 17–63)
AST: 31 U/L (ref 15–41)
Albumin: 3.8 g/dL (ref 3.5–5.0)
Anion gap: 8 (ref 5–15)
BILIRUBIN TOTAL: 0.9 mg/dL (ref 0.3–1.2)
BUN: 8 mg/dL (ref 6–20)
CO2: 27 mmol/L (ref 22–32)
CREATININE: 1.12 mg/dL (ref 0.61–1.24)
Calcium: 9.1 mg/dL (ref 8.9–10.3)
Chloride: 105 mmol/L (ref 101–111)
GFR calc Af Amer: >60 mL/min (ref >60)
Glucose, Bld: 145 mg/dL — ABNORMAL HIGH (ref 65–99)
Potassium: 4 mmol/L (ref 3.5–5.1)
Sodium: 140 mmol/L (ref 135–145)
TOTAL PROTEIN: 6.9 g/dL (ref 6.5–8.1)

## 2015-10-20 LAB — LIPID PANEL
Cholesterol: 180 mg/dL (ref 0–200)
HDL: 32 mg/dL — AB (ref >40)
LDL Cholesterol: 133 mg/dL — ABNORMAL HIGH (ref 0–99)
Total CHOL/HDL Ratio: 5.6 RATIO
Triglycerides: 77 mg/dL (ref <150)
VLDL: 15 mg/dL (ref 0–40)

## 2015-10-20 MED ORDER — HALOPERIDOL LACTATE 5 MG/ML IJ SOLN: 0.5000 mg | INTRAMUSCULAR | Status: DC | PRN

## 2015-10-20 MED ORDER — LORAZEPAM 1 MG PO TABS: 1.0000 mg | ORAL_TABLET | ORAL | Status: DC | PRN

## 2015-10-20 MED ORDER — CETYLPYRIDINIUM CHLORIDE 0.05 % MT LIQD
7.0000 mL | Freq: Two times a day (BID) | OROMUCOSAL | Status: DC
  Administered 2015-10-20: 7 mL via OROMUCOSAL

## 2015-10-20 MED ORDER — MORPHINE SULFATE (PF) 2 MG/ML IV SOLN
1.0000 mg | INTRAVENOUS | Status: DC | PRN
  Administered 2015-10-20: 1 mg via INTRAVENOUS
  Filled 2015-10-20: qty 1

## 2015-10-20 MED ORDER — BIOTENE DRY MOUTH MT LIQD: 15.0000 mL | OROMUCOSAL | Status: DC | PRN

## 2015-10-20 MED ORDER — POLYVINYL ALCOHOL 1.4 % OP SOLN
1.0000 [drp] | Freq: Four times a day (QID) | OPHTHALMIC | Status: DC | PRN
  Filled 2015-10-20: qty 15

## 2015-10-20 MED ORDER — GLYCOPYRROLATE 1 MG PO TABS
1.0000 mg | ORAL_TABLET | ORAL | Status: DC | PRN
  Filled 2015-10-20: qty 1

## 2015-10-20 MED ORDER — ONDANSETRON 4 MG PO TBDP
4.0000 mg | ORAL_TABLET | Freq: Four times a day (QID) | ORAL | Status: DC | PRN
  Filled 2015-10-20: qty 1

## 2015-10-20 MED ORDER — HALOPERIDOL 0.5 MG PO TABS
0.5000 mg | ORAL_TABLET | ORAL | Status: DC | PRN
  Filled 2015-10-20: qty 1

## 2015-10-20 MED ORDER — GLYCOPYRROLATE 0.2 MG/ML IJ SOLN
0.2000 mg | INTRAMUSCULAR | Status: DC | PRN
  Filled 2015-10-20: qty 1

## 2015-10-20 MED ORDER — GLYCOPYRROLATE 0.2 MG/ML IJ SOLN
0.4000 mg | INTRAMUSCULAR | Status: DC
  Administered 2015-10-20 – 2015-10-21 (×3): 0.4 mg via INTRAVENOUS
  Filled 2015-10-20 (×3): qty 2

## 2015-10-20 MED ORDER — GLYCOPYRROLATE 0.2 MG/ML IJ SOLN
0.2000 mg | INTRAMUSCULAR | Status: DC | PRN
  Filled 2015-10-20 (×2): qty 2

## 2015-10-20 MED ORDER — ACETAMINOPHEN 650 MG RE SUPP
650.0000 mg | Freq: Four times a day (QID) | RECTAL | Status: DC | PRN
Start: 2015-10-20 — End: 2015-10-21

## 2015-10-20 MED ORDER — LORAZEPAM 2 MG/ML PO CONC: 1.0000 mg | ORAL | Status: DC | PRN

## 2015-10-20 MED ORDER — ASPIRIN 300 MG RE SUPP
300.0000 mg | Freq: Every day | RECTAL | Status: DC
  Filled 2015-10-20: qty 1

## 2015-10-20 MED ORDER — HALOPERIDOL LACTATE 2 MG/ML PO CONC
0.5000 mg | ORAL | Status: DC | PRN
  Filled 2015-10-20: qty 0.3

## 2015-10-20 MED ORDER — ASPIRIN EC 325 MG PO TBEC: 325.0000 mg | DELAYED_RELEASE_TABLET | Freq: Every day | ORAL | Status: DC

## 2015-10-20 MED ORDER — ONDANSETRON HCL 4 MG/2ML IJ SOLN: 4.0000 mg | Freq: Four times a day (QID) | INTRAMUSCULAR | Status: DC | PRN

## 2015-10-20 MED ORDER — LORAZEPAM 2 MG/ML IJ SOLN
1.0000 mg | INTRAMUSCULAR | Status: DC | PRN
  Administered 2015-10-20 – 2015-10-21 (×2): 1 mg via INTRAVENOUS
  Filled 2015-10-20 (×2): qty 1

## 2015-10-20 MED ORDER — HYDROMORPHONE HCL 1 MG/ML IJ SOLN
0.5000 mg | INTRAMUSCULAR | Status: DC | PRN
  Administered 2015-10-20: 0.5 mg via INTRAVENOUS
  Filled 2015-10-20: qty 1

## 2015-10-20 MED ORDER — ACETAMINOPHEN 325 MG PO TABS: 650.0000 mg | ORAL_TABLET | Freq: Four times a day (QID) | ORAL | Status: DC | PRN

## 2015-10-20 NOTE — Progress Notes (Addendum)
STROKE TEAM PROGRESS NOTE   HISTORY OF PRESENT ILLNESS (per record) Darrell Knapp is a 55 y.o. male with a history of heart attack requiring coronary bypass here in the past as well as stroke who [place] town [place]. He was found by housekeeping at his hotel lodged between 2 mattresses. He was brought into the emergency department where was recognized that he had signs consistent with a large left MCA infarct and therefore neurology was consulted. Given an unknown onset of symptoms he was taken for stat CT perfusion which unfortunately showed that he had already completed a very large infarction. His LKW was yesterday 10/18/2015 at 10p. Patient was not administered IV t-PA secondary to being outside the window.  I discussed with his wife at length that he had a very large stroke that I was concerned that he may have edema and swelling associated with this. She was very clear that his previous wishes are that he would want no life sustaining measures if he were to have significant deficits. He was admitted for further evaluation and treatment.   SUBJECTIVE (INTERVAL HISTORY) His wife is at the bedside.  Overall his condition is gradually worsening. He had more cough and sometimes holding his head with left hand. No language and not open eyes. Wife stated that pt has made it clear in all occasions that if pt suffers condition that not able function as normal himself, he would not want any aggressive measures. Especially in a condition that he may not able to have decent quality of life, may have language difficulty or not able to walk, he would not want that kind of quality of life. Wife declined 3% saline, surgical intervention or trach or PEG or NG tube or central line. She requests on behalf of pt for comfort care and hospice placement.   OBJECTIVE Temp:  [97.7 F (36.5 C)-99.1 F (37.3 C)] 98.8 F (37.1 C) (05/26 0319) Pulse Rate:  [68-96] 74 (05/26 0319) Cardiac Rhythm:  [-] Normal  sinus rhythm;Bundle branch block (05/26 0000) Resp:  [22-28] 24 (05/26 0319) BP: (139-175)/(70-132) 139/70 mmHg (05/26 0319) SpO2:  [92 %-100 %] 92 % (05/26 0319) Weight:  [113.399 kg (250 lb)] 113.399 kg (250 lb) (05/25 1202)  CBC:  Recent Labs Lab 12/27/2015 1258 12/27/2015 1306 10/20/15 0523  WBC 10.5  --  12.1*  NEUTROABS 8.8*  --   --   HGB 16.0 16.7 14.9  HCT 46.5 49.0 43.4  MCV 85.3  --  86.6  PLT 254  --  261    Basic Metabolic Panel:  Recent Labs Lab 12/27/2015 1258 12/27/2015 1306 10/20/15 0523  NA 137 142 140  K 4.8 4.0 4.0  CL 104 100* 105  CO2 22  --  27  GLUCOSE 108* 114* 145*  BUN 12 13 8   CREATININE 0.98 1.00 1.12  CALCIUM 9.1  --  9.1    Lipid Panel:    Component Value Date/Time   CHOL 180 10/20/2015 0523   TRIG 77 10/20/2015 0523   HDL 32* 10/20/2015 0523   CHOLHDL 5.6 10/20/2015 0523   VLDL 15 10/20/2015 0523   LDLCALC 133* 10/20/2015 0523   HgbA1c:  Lab Results  Component Value Date   HGBA1C 7.0* 01/05/2015   Urine Drug Screen:    Component Value Date/Time   LABOPIA NONE DETECTED 06-13-15 2056   COCAINSCRNUR NONE DETECTED 06-13-15 2056   LABBENZ NONE DETECTED 06-13-15 2056   AMPHETMU NONE DETECTED 06-13-15 2056  THCU NONE DETECTED 10/04/2015 2056   LABBARB NONE DETECTED 10/24/2015 2056      IMAGING I have personally reviewed the radiological images below and agree with the radiology interpretations.  Ct Head Wo Contrast 10/20/2015   1. Expected evolution of large left MCA territory infarct without significant hemorrhage. 2. Hyperdense left MCA compatible with thrombus.  10/04/2015   Large territory acute infarct left MCA territory. Negative for hemorrhage.  Ct Angio Head & Neck W/cm &/or Wo/cm 10/01/2015  Large territory acute infarct left MCA territory without hemorrhage. 50% diameter stenosis proximal left internal carotid artery. 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 M1 and  most of the left MCA branches are occluded. Left anterior cerebral artery supplied from the right. No significant right carotid stenosis. Both vertebral arteries patent.  Ct Cerebral Perfusion W/cm 10/02/2015  Large territory infarct of the MCA territory. Possible small amount of penumbra.   Dg Chest Port 1 View 10/25/2015   Low lung volumes with mild bibasilar atelectasis. No focal airspace disease or other significant change.   Dg Foot Complete Right 10/03/2015   No acute fracture or dislocation identified about the right foot.    PHYSICAL EXAM  Temp:  [97.7 F (36.5 C)-99.1 F (37.3 C)] 98.5 F (36.9 C) (05/26 0800) Pulse Rate:  [68-96] 74 (05/26 0319) Resp:  [22-28] 24 (05/26 0319) BP: (139-175)/(70-132) 149/84 mmHg (05/26 0800) SpO2:  [92 %-100 %] 95 % (05/26 0800) Weight:  [250 lb (113.399 kg)] 250 lb (113.399 kg) (05/25 1202)  General - Well nourished, well developed, in no apparent distress, sleepy with occasional cough.  Ophthalmologic - Fundi not visualized due to noncooperation.  Cardiovascular - Regular rate and rhythm.  Neuro - limited exam due to comfort care Sleepy and not open eyes on voice, not following commands, no language output, occasional cough. Eyes in mid position, PERRL, no obvious facial droop, right hemiplegia, no spontaneous movement, left UE and LE spontaneous  purposeful movement. LUE against gravity.   ASSESSMENT/PLAN [name] is a 55 y.o. male with history of CAD s/p CABG x 3, prior stroke, HTN and seizure found down with aphasia and R hemiparesis. He did not receive IV t-PA due to delay in arrival.   Stroke:  Dominant left large MCA infarct, embolic secondary to unknown source  Resultant  Right hemiplegia, dysphagia, global aphasia  CTA H&N  Large L MCA infarct. L ICA 50%. Occluded L supraclinoid ICA, L MCA M1, and most of L MC branches. LACA supplied from right.  CT perfusion large L MCA territory infarct with no salvageable  deficit  Repeat CT expected evolution large L MCA infarct w/o sign hmg  LDL 133  HgbA1c pending  SCDs for VTE prophylaxis  Diet NPO time specified  aspirin 325 mg daily and clopidogrel 75 mg daily prior to admission, now on aspirin 300 mg suppository daily. Discontinued ASA suppository due to comfort care  Ongoing aggressive stroke risk factor management for now. Wife on her way from [place]. Wife clear that patient would not want to live disabled, would not want any life-prolonging therapies. He is a DNR. Had long discussion with wife at bedside, will proceed with comfort care and hospice placement. I relayed this info to IM service.   Hypertension  Stable  Permissive hypertension (OK if < 220/120)   Hyperlipidemia  Home meds:  lipitor 80, resumed in hospital  LDL 133, goal < 70  Will d/c for comfort care  Other Stroke Risk Factors  Obesity, Body mass index is 35.87 kg/(m^2).   Hx stroke/TIA  Family hx stroke (mother)  Coronary artery disease s/p CABG   Other Active Problems  Seizure d/o on Keppra  HFrEF  Hospital day # 1  I had long discussion with wife at bedside. Wife stated that pt has made it clear in all occasions that if pt suffers condition that leaves him disabled and not have decent quality of life, he would not want any aggressive measures. Wife has declined 3% saline, surgical intervention or trach or PEG or NG tube or central line. She requests on behalf of pt for comfort care and hospice placement. I have relayed wife decision to internal medicine service  Neurology will sign off. Please call with questions.  Thanks for the consult.   Marvel Plan, MD PhD Stroke Neurology 10/20/2015 11:19 AM   To contact Stroke Continuity provider, please refer to [email or web address]. After hours, contact General Neurology

## 2015-10-20 NOTE — Evaluation (Signed)
Speech Language Pathology Evaluation Patient Details Name: Darrell Knapp MRN: [id number] DOB: 1960/06/09 Today's Date: 10/20/2015 Time: 4098-1191 SLP Time Calculation (min) (ACUTE ONLY): 10 min  Problem List:  Patient Active Problem List   Diagnosis Date Noted  . CVA (cerebral infarction) 10/17/2015  . Cerebral infarction due to thrombosis of middle cerebral artery (HCC)   . Congestive dilated cardiomyopathy (HCC)   . Abnormal echocardiogram   . HLD (hyperlipidemia)   . 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 Sinusitis, chronic 01/05/2015  . Aphasia   . TIA (transient ischemic attack) 01/04/2015  . Hypertension 01/04/2015  . CAD (coronary artery disease) 01/04/2015  . Renal failure 01/04/2015   Past Medical History:  Past Medical History  Diagnosis Date  . Coronary artery disease   . Cardiac arrest (HCC)   . Heart attack (HCC)   . Hypertension   . Stroke (HCC)   . Gout    Past Surgical History:  Past Surgical History  Procedure Laterality Date  . Bypass graft    . Cardiac catheterization N/A 01/09/2015    Procedure: Left Heart Cath and Cors/Grafts Angiography;  Surgeon: Kathleene Hazel, MD;  Location: [facility] INVASIVE CV LAB;  Service: Cardiovascular;  Laterality: N/A;  . Tee without cardioversion N/A 01/10/2015    Procedure: TRANSESOPHAGEAL ECHOCARDIOGRAM (TEE);  Surgeon: Chilton Si, MD;  Location: [facility] ENDOSCOPY;  Service: Cardiovascular;  Laterality: N/A;   HPI:  55 yo male adm to [facility] after being found down at a hotel with right paralysis, right droop - has h/o CAD, CABB, HTN.  Pt found to have left MCA CVA.   Wife also reports pt has h/o mild CVAs.  Swallow/speech evaluation ordered.    Assessment / Plan / Recommendation Clinical Impression  Pt is globally aphasic resulting in inability to phonate and articulate.  He follows one step commands with 60% accuracy without cues = 75% with visual cues.  Decreased ability to  answer functional yes/no questions re: pain, breathing comfort, etc.  Pt did appropriately nod "no" to SLP question x1.  Wife states pt "does not want to live this way".  SLP will follow to aid with basic language skills to maximize function.  Concern for possible edema worsening symptoms currently.      SLP Assessment  Patient needs continued Speech Lanaguage Pathology Services    Follow Up Recommendations  Skilled Nursing facility    Frequency and Duration min 2x/week  2 weeks      SLP Evaluation Prior Functioning  Cognitive/Linguistic Baseline: Within functional limits  Lives With: Spouse Education: high level of education and highly demanding job - [place] people [place] per wife   Cognition  Overall Cognitive Status: Impaired/Different from baseline Arousal/Alertness: Lethargic Orientation Level: Oriented to person Memory:  (appears to recognize wife - looking to her during evaluation) Problem Solving: Impaired Problem Solving Impairment: Functional basic (minimally pushing against SlP during repositioning)    Comprehension  Auditory Comprehension Overall Auditory Comprehension: Impaired Yes/No Questions: Not tested Commands: Impaired One Step Basic Commands: 25-49% accurate Interfering Components: Attention;Motor planning;Processing speed (suspect interfering) Visual Recognition/Discrimination Discrimination: Not tested Reading Comprehension Reading Status: Not tested    Expression Expression Primary Mode of Expression: Verbal Verbal Expression Overall Verbal Expression: Impaired Initiation:  (no phonation) Automatic Speech:  (no attempts) Repetition:  (no attempts) Naming: Not tested Pragmatics: Unable to assess Interfering Components: Attention   Oral / Motor  Oral Motor/Sensory Function Overall Oral Motor/Sensory Function:  (pt did  not consistently follow commands, right facial asymmetry minimal, minimal lingual movement, pt did not phonate but  made attempts)   GO                    Mills KollerKimball, Adelene Polivka Ann Jakaiden Fill, MS [facility] SLP [phone]

## 2015-10-20 NOTE — Progress Notes (Signed)
Received report

## 2015-10-20 NOTE — Evaluation (Addendum)
Clinical/Bedside Swallow Evaluation Patient Details  Name: Darrell Knapp MRN: [id number] Date of Birth: [birth year]  Today's Date: 10/20/2015 Time: SLP Start Time (ACUTE ONLY): [id number] SLP Stop Time (ACUTE ONLY): 0845 SLP Time Calculation (min) (ACUTE ONLY): 10 min  Past Medical History:  Past Medical History  Diagnosis Date  . Coronary artery disease   . Cardiac arrest (HCC)   . Heart attack (HCC)   . Hypertension   . Stroke (HCC)   . Gout    Past Surgical History:  Past Surgical History  Procedure Laterality Date  . Bypass graft    . Cardiac catheterization N/A 01/09/2015    Procedure: Left Heart Cath and Cors/Grafts Angiography;  Surgeon: Kathleene Hazelhristopher D McAlhany, MD;  [facility];  Service: Cardiovascular;  Laterality: N/A;  . Tee without cardioversion N/A 01/10/2015    Procedure: TRANSESOPHAGEAL ECHOCARDIOGRAM (TEE);  Surgeon: Chilton Siiffany Creston, MD;  [facility];  Service: Cardiovascular;  Laterality: N/A;   HPI:  55 [facility] being found down at a hotel with right paralysis, right droop - has h/o CAD, CABB, HTN.  Pt found to have left MCA CVA.   Wife also reports pt has h/o mild CVAs.  Swallow/speech evaluation ordered.    Assessment / Plan / Recommendation Clinical Impression  Pt currently presents with poor management of secretions characterized by subtle and grossly weak throat clearing indicative of secretions on vocal folds.  Due to impact of CVA, pt unable to phonate but attempts.  He also appears with shallow rapid breathing during evaluation concerning for swallow/respiration coordination.  No gag response with SLP stimulation concerning for vagal nerve involvement.   SLP did not provide pt with po trials due to likely aspiration of secretions and likely severe dysphagia.  SLP orally suctioned due to retained viscous secretions posterior oral cavity without pt awareness.    Recommend NPO with oral care =  Of note, pt did follow some one  step commands during session but had difficulty keeping eyes open *right more than left and did not form functional sounds.    SLP to follow up for po readiness.  Of note, spouse present and reports pt "did not want to live like this" also stating he will not "have a feeding tube".      Aspiration Risk  Risk for inadequate nutrition/hydration;Severe aspiration risk    Diet Recommendation NPO   Medication Administration: Via alternative means    Other  Recommendations Oral Care Recommendations: Oral care QID   Follow up Recommendations  Skilled Nursing facility TBD   Frequency and Duration min 2x/week  1 week       Prognosis Prognosis for Safe Diet Advancement: Guarded Barriers to Reach Goals: Severity of deficits;Time post onset      Swallow Study   General Date of Onset: 10/20/15 HPI: 55 [facility] being found down at a hotel with right paralysis, right droop - has h/o CAD, CABB, HTN.  Pt found to have left MCA CVA.   Wife also reports pt has h/o mild CVAs.  Swallow/speech evaluation ordered.  Type of Study: Bedside Swallow Evaluation Diet Prior to this Study: NPO Temperature Spikes Noted: Yes Respiratory Status: Nasal cannula History of Recent Intubation: No Behavior/Cognition: Lethargic/Drowsy Oral Cavity Assessment: Excessive secretions Oral Care Completed by SLP: No Oral Cavity - Dentition: Adequate natural dentition Self-Feeding Abilities: Total assist Patient Positioning: Upright in bed Baseline Vocal Quality: Not observed;Aphonic Volitional Cough: Weak Volitional Swallow: Unable  to elicit    Oral/Motor/Sensory Function Overall Oral Motor/Sensory Function:  (pt did not consistently follow commands, right facial asymmetry minimal, minimal lingual movement, pt did not phonate but made attempts)   Ice Chips Ice chips: Not tested   Thin Liquid Thin Liquid: Not tested    Nectar Thick Nectar Thick Liquid: Not tested   Honey Thick Honey Thick Liquid: Not  tested   Puree Puree: Not tested   Solid   GO   Solid: Not tested        Darrell Knapp, Darrell Knapp Darrell Knapp SLP [phone]

## 2015-10-20 NOTE — Progress Notes (Signed)
Nutrition Brief Note  Pt identified on the </= braden score report. Goal of care is comfort measures. No nutrition interventions warranted at this time.  Please consult as needed.   Maureen ChattersKatie Joaovictor Krone, RD, LDN Pager #: [phone] After-Hours Pager #: [phone]

## 2015-10-20 NOTE — Consult Note (Signed)
Consultation Note Date: 10/20/2015   Patient Name: Darrell Knapp  DOB: 1960/07/09  MRN: [id number]  Age / Sex: 55 y.o., male  PCP: No primary care provider on file. Referring Physician: Inez Catalina, MD  Reason for Consultation: Non pain symptom management, Pain control, Psychosocial/spiritual support and Terminal Care  HPI/Patient Profile: 55 y.o. male  with past medical history of Coronary artery disease status post CABG, hypertension, hyperlipidemia admitted on 09/25/2015 with large MCA territory infarct. Patient resides in [place], and was in [place] on business and when he didn't report to work he was found down in his hotel room. His wife has arrived from [place] and stated "this is his worst nightmare" she states that they have had many discussions regarding end of life after he had his heart attack and bypass surgery approximately 5 years ago. She states he would not want to live with a feeding tube ventilated or with any debility. They have 4 children. They are on their way to [place] from [place] at this point..   Clinical Assessment and Goals of Care: Patient is only responsive to noxious stimuli. He is very congested. He has vomited a small amount of dark brown liquid. Likely acute aspiration in addition to large CVA. Wife is at the bedside  NEXT OF KIN spouse    SUMMARY OF RECOMMENDATIONS   DNR/DNI No PEG tube artificial feeding Full Comfort Care Code Status/Advance Care Planning:  DNR    Symptom Management:   Pain: No current nonverbal signs and symptoms of pain however wife states he has been rubbing his head off and on today. Continue with morphine when necessary and will monitor need for scheduled dosing  Dyspnea: Continue with targeted pulmonary treatments such as O2 via nasal cannula as tolerated; primary management via opioids  Secretions: Needs improvement. We'll start  scheduled Robinul 0.4 mg IV every 4 hours around-the-clock and when necessary  Seizures: Continue IV Keppra every 12  Palliative Prophylaxis:   Aspiration, Bowel Regimen, Eye Care, Frequent Pain Assessment, Oral Care and Turn Reposition  Additional Recommendations (Limitations, Scope, Preferences):  Full Comfort Care and Full Scope Treatment  Psycho-social/Spiritual:   Desire for further Chaplaincy support:no  Additional Recommendations: Grief/Bereavement Support  Prognosis:   Hours - Days  Discharge Planning: Anticipated Hospital Death      Primary Diagnoses: Present on Admission:  . CVA (cerebral infarction) . Aphasia . CAD (coronary artery disease) . HLD (hyperlipidemia) . Renal failure . Hypertension  I have reviewed the medical record, interviewed the patient and family, and examined the patient. The following aspects are pertinent.  Past Medical History  Diagnosis Date  . Coronary artery disease   . Cardiac arrest (HCC)   . Heart attack (HCC)   . Hypertension   . Stroke (HCC)   . Gout    Social History   Social History  . Marital Status: Married    Spouse Name: N/A  . Number of Children: N/A  . Years of Education: N/A   Social History Main Topics  .  Smoking status: Never Smoker   . Smokeless tobacco: None  . Alcohol Use: No  . Drug Use: None  . Sexual Activity: Not Currently    Birth Control/ Protection: None   Other Topics Concern  . None   Social History Narrative   Family History  Problem Relation Age of Onset  . Stroke Mother   . Heart attack Father   . Kidney failure Brother    Scheduled Meds: .  stroke: mapping our early stages of recovery book   Does not apply Once  . antiseptic oral rinse  7 mL Mouth Rinse BID  . glycopyrrolate  0.4 mg Intravenous Q4H  . levETIRAcetam  500 mg Intravenous Q12H   Continuous Infusions:  PRN Meds:.[DISCONTINUED] acetaminophen **OR** acetaminophen, antiseptic oral rinse, [DISCONTINUED]  glycopyrrolate **OR** glycopyrrolate **OR** [DISCONTINUED] glycopyrrolate, [DISCONTINUED] haloperidol **OR** [DISCONTINUED] haloperidol **OR** haloperidol lactate, [DISCONTINUED] LORazepam **OR** [DISCONTINUED] LORazepam **OR** LORazepam, morphine injection, [DISCONTINUED] ondansetron **OR** ondansetron (ZOFRAN) IV, polyvinyl alcohol Medications Prior to Admission:  Prior to Admission medications   Medication Sig Start Date End Date Taking? Authorizing Provider  aspirin EC 325 MG EC tablet Take 1 tablet (325 mg total) by mouth daily. 01/09/15   Christiane Haorinna L Sullivan, MD  atorvastatin (LIPITOR) 80 MG tablet Take 1 tablet (80 mg total) by mouth daily at 6 PM. 01/10/15   Joseph ArtJessica U Vann, DO  carvedilol (COREG) 3.125 MG tablet Take 1 tablet (3.125 mg total) by mouth 2 (two) times daily with a meal. 01/10/15   Joseph ArtJessica U Vann, DO  clopidogrel (PLAVIX) 75 MG tablet Take 1 tablet (75 mg total) by mouth daily. 01/10/15   Joseph ArtJessica U Vann, DO  levETIRAcetam (KEPPRA) 500 MG tablet Take 1 tablet (500 mg total) by mouth 2 (two) times daily. 01/10/15   Joseph ArtJessica U Vann, DO  lisinopril (PRINIVIL,ZESTRIL) 20 MG tablet Take 1 tablet (20 mg total) by mouth daily. 01/10/15   Joseph ArtJessica U Vann, DO   No Known Allergies Review of Systems  Unable to perform ROS: Acuity of condition    Physical Exam  Constitutional: He appears well-developed and well-nourished.  HENT:  Head: Normocephalic and atraumatic.  Cardiovascular:  Dropping; 60's  Pulmonary/Chest:  Very congested. No increased work of breathing  Abdominal: Soft.  Genitourinary:  foley  Neurological:  Unresponsive except to noxious stimuli  Skin: Skin is warm.  Nursing note and vitals reviewed.   Vital Signs: BP 144/95 mmHg  Pulse 74  Temp(Src) 100.1 F (37.8 C) (Axillary)  Resp 22  Ht 5\' 10"  (1.778 m)  Wt 113.399 kg (250 lb)  BMI 35.87 kg/m2  SpO2 95% Pain Assessment: PAINAD       SpO2: SpO2: 95 % O2 Device:SpO2: 95 % O2 Flow Rate: .O2 Flow Rate  (L/min): 2 L/min  IO: Intake/output summary:  Intake/Output Summary (Last 24 hours) at 10/20/15 1524 Last data filed at 10/20/15 0640  Gross per 24 hour  Intake 1174.58 ml  Output   1625 ml  Net -450.42 ml    LBM:   Baseline Weight: Weight: 113.399 kg (250 lb) Most recent weight: Weight: 113.399 kg (250 lb)     Palliative Assessment/Data:   Flowsheet Rows        Most Recent Value   Intake Tab    Referral Department  Hospitalist   Unit at Time of Referral  Intermediate Care Unit   Palliative Care Primary Diagnosis  Neurology   Date Notified  10/20/15   Palliative Care Type  New Palliative care   Reason  for referral  Clarify Goals of Care, Pain, Non-pain Symptom, End of Life Care Assistance   Date of Admission  10/18/2015   Date first seen by Palliative Care  10/20/15   # of days Palliative referral response time  0 Day(s)   # of days IP prior to Palliative referral  1   Clinical Assessment    Palliative Performance Scale Score  10%   Pain Max last 24 hours  Not able to report   Pain Min Last 24 hours  Not able to report   Dyspnea Max Last 24 Hours  Not able to report   Dyspnea Min Last 24 hours  Not able to report   Nausea Max Last 24 Hours  Not able to report   Nausea Min Last 24 Hours  Not able to report   Anxiety Max Last 24 Hours  Not able to report   Anxiety Min Last 24 Hours  Not able to report   Other Max Last 24 Hours  Not able to report   Psychosocial & Spiritual Assessment    Palliative Care Outcomes    Patient/Family meeting held?  Yes   Who was at the meeting?  wife   Palliative Care Outcomes  Improved pain interventions, Improved non-pain symptom therapy, Provided end of life care assistance, Provided psychosocial or spiritual support   Patient/Family wishes: Interventions discontinued/not started   Mechanical Ventilation, Tube feedings/TPN, NIPPV, BiPAP, Hemodialysis, Trach, PEG, Transfusion, Vasopressors, Transfer out of ICU, Antibiotics   Palliative Care  follow-up planned  Yes, Facility      Time In: 1400 Time Out: 1515 Time Total: 75 min Greater than 50%  of this time was spent counseling and coordinating care related to the above assessment and plan.  Signed by: Irean Hong, NP   Please contact Palliative Medicine Team phone at [phone] for questions and concerns.  For individual provider: See Loretha Stapler

## 2015-10-20 NOTE — Progress Notes (Signed)
Subjective: Patient seen and examined this morning on rounds.  No further acute events overnight.  Wife is present in the room who reports that patient will gag periodically but mostly is sleeping.  She states he knows that she is here.  She reiterates that he would not want to live disabled and that as he is right now would be "his worst nightmare".  Objective: Vital signs in last 24 hours: Filed Vitals:   10/18/2015 2319 10/20/15 0319 10/20/15 0800 10/20/15 1307  BP: 157/85 139/70 149/84 144/95  Pulse: 68 74    Temp: 99.1 F (37.3 C) 98.8 F (37.1 C) 98.5 F (36.9 C) 100.1 F (37.8 C)  TempSrc: Oral Axillary Oral Axillary  Resp: 28 24  22   Height:      Weight:      SpO2: 96% 92% 95%    Weight change:   Intake/Output Summary (Last 24 hours) at 10/20/15 1310 Last data filed at 10/20/15 0640  Gross per 24 hour  Intake 1174.58 ml  Output   1625 ml  Net -450.42 ml   General: resting in bed, no apparent distress, coughing intermittent HEENT: PERRL Cardiac: RRR, no rubs, murmurs or gallops Pulm: CTAB, wearing [place] for supplemental oxygen Abd: soft Ext: warm and well perfused, no pedal edema Neuro: aphasic, right sided paresis, moving left side spontaneously on occasion    Lab Results: Basic Metabolic Panel:  Recent Labs Lab 10/20/2015 1258 09/30/2015 1306 10/20/15 0523  NA 137 142 140  K 4.8 4.0 4.0  CL 104 100* 105  CO2 22  --  27  GLUCOSE 108* 114* 145*  BUN 12 13 8   CREATININE 0.98 1.00 1.12  CALCIUM 9.1  --  9.1   Liver Function Tests:  Recent Labs Lab 10/08/2015 1258 10/20/15 0523  AST 43* 31  ALT 26 23  ALKPHOS 65 61  BILITOT 0.9 0.9  PROT 6.6 6.9  ALBUMIN 3.8 3.8   CBC:  Recent Labs Lab 10/23/2015 1258 10/07/2015 1306 10/20/15 0523  WBC 10.5  --  12.1*  NEUTROABS 8.8*  --   --   HGB 16.0 16.7 14.9  HCT 46.5 49.0 43.4  MCV 85.3  --  86.6  PLT 254  --  261   Fasting Lipid Panel:  Recent Labs Lab 10/20/15 0523  CHOL 180  HDL 32*  LDLCALC  133*  TRIG 77  CHOLHDL 5.6   Coagulation:  Recent Labs Lab 10/08/2015 1258  LABPROT 14.1  INR 1.07   Urine Drug Screen: Drugs of Abuse     Component Value Date/Time   LABOPIA NONE DETECTED 10/20/2015 2056   COCAINSCRNUR NONE DETECTED 10/11/2015 2056   LABBENZ NONE DETECTED 10/23/2015 2056   AMPHETMU NONE DETECTED 09/25/2015 2056   THCU NONE DETECTED 09/25/2015 2056   LABBARB NONE DETECTED 10/02/2015 2056    Alcohol Level:  Recent Labs Lab 09/25/2015 1258  ETH <5   Urinalysis:  Recent Labs Lab 09/26/2015 2056  COLORURINE YELLOW  LABSPEC 1.039*  PHURINE 6.5  GLUCOSEU NEGATIVE  HGBUR NEGATIVE  BILIRUBINUR NEGATIVE  KETONESUR NEGATIVE  PROTEINUR NEGATIVE  NITRITE NEGATIVE  LEUKOCYTESUR NEGATIVE     Micro Results: Recent Results (from the past 240 hour(s))  MRSA PCR Screening     Status: None   Collection Time: 10/09/2015  6:36 PM  Result Value Ref Range Status   MRSA by PCR NEGATIVE NEGATIVE Final    Comment:        The GeneXpert MRSA Assay (FDA approved for  NASAL specimens only), is one component of a comprehensive MRSA colonization surveillance program. It is not intended to diagnose MRSA infection nor to guide or monitor treatment for MRSA infections.    Studies/Results: Ct Angio Head W/cm &/or Wo Cm  09/26/2015  CLINICAL DATA:  Right-sided weakness.  Wake up stroke. EXAM: CT ANGIOGRAPHY HEAD AND NECK TECHNIQUE: Multidetector CT imaging of the head and neck was performed using the standard protocol during bolus administration of intravenous contrast. Multiplanar CT image reconstructions and MIPs were obtained to evaluate the vascular anatomy. Carotid stenosis measurements (when applicable) are obtained utilizing NASCET criteria, using the distal internal carotid diameter as the denominator. CONTRAST:  90 mL Isovue 370 IV COMPARISON:  None. FINDINGS: CTA NECK Aortic arch: Normal aortic arch. Proximal great vessels patent. Lung apices clear. Right carotid  system: Right common carotid artery patent. Atherosclerotic calcification the carotid bifurcation without significant stenosis. Right internal carotid artery widely patent. Right external carotid artery widely patent. Left carotid system: Left common carotid artery widely patent. Atherosclerotic calcification at the bifurcation. 50% diameter stenosis proximal left internal carotid artery. Small caliber left internal carotid artery due to decreased outflow from distal occlusion. Vertebral arteries:Both vertebral arteries patent to the basilar without significant stenosis. Skeleton: Mild disc degeneration in the cervical spine. No fracture or bone lesion. Other neck: Negative for adenopathy. CTA HEAD Anterior circulation: Right cavernous carotid widely patent. Right anterior and middle cerebral arteries widely patent. Left anterior cerebral artery is patent and appears to be supplied from the right side. Small caliber left cavernous carotid which occludes in the supraclinoid segment. Left middle cerebral artery is occluded with minimal filling of left MCA branches from collaterals. Left A1 segment is patent due to retrograde flow Posterior circulation: Both vertebral arteries patent to the basilar without significant stenosis. PICA patent bilaterally. Basilar widely patent. AICA, superior cerebellar, and posterior cerebral arteries widely patent. Venous sinuses: Patent Anatomic variants: None Delayed phase: Not performed IMPRESSION: Large territory acute infarct left MCA territory without hemorrhage. 50% diameter stenosis proximal left internal carotid artery. 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 M1 and most of the left MCA branches are occluded. Left anterior cerebral artery supplied from the right. No significant right carotid stenosis. Both vertebral arteries patent. I discussed the findings with Dr. Amada Jupiter at 1330 hours on 10/17/2015. Electronically Signed    By: Marlan Palau M.D.   On: 10/09/2015 14:47   Ct Head Wo Contrast  10/20/2015  CLINICAL DATA:  Large left MCA territory infarct. EXAM: CT HEAD WITHOUT CONTRAST TECHNIQUE: Contiguous axial images were obtained from the base of the skull through the vertex without intravenous contrast. COMPARISON:  CT a tendency to fusion 10/16/2015. FINDINGS: Expected evolution of a large left MCA territory infarct. This involves the majority of the left fifth MCA territory with. There is some sparing of the anterior lentiform nucleus and caudate head. A hyperdense left MCA is noted. Mass effect is noted with effacement of the sulci and 5.5 mm midline shift to the right at the level of the foramen of Monro. This is new since yesterday. There is partial effacement of the left lateral ventricle. The basal cisterns are intact. The cerebellum is unremarkable. Mild mucosal thickening is present in the anterior ethmoid air cells. Fluid or soft tissue in the superior aspect of the left frontal sinus may be sequestered as a mucocele. Chronic bone changes are noted. The paranasal sinuses and mastoid air cells are otherwise clear.  The calvarium is intact. The globes and orbits are within normal limits. IMPRESSION: 1. Expected evolution of large left MCA territory infarct without significant hemorrhage. 2. Hyperdense left MCA compatible with thrombus. Electronically Signed   By: Marin Roberts M.D.   On: 10/20/2015 07:41   Ct Head Wo Contrast  10/22/2015  CLINICAL DATA:  Right-sided weakness.  Wake up stroke. EXAM: CT HEAD WITHOUT CONTRAST TECHNIQUE: Contiguous axial images were obtained from the base of the skull through the vertex without intravenous contrast. COMPARISON:  MRI 01/04/2015 FINDINGS: Low density throughout the left MCA territory compatible with acute infarct. This involves most of the temporal lobe as well as most of the parietal lobe. Left frontal temporal lobe also involved. There is some involvement of the  insula and lateral basal ganglia. Negative for hemorrhage.  No shift of the midline structures. Ventricle size normal.  No other acute infarct.  No mass lesion. Negative calvarium. IMPRESSION: Large territory acute infarct left MCA territory. Negative for hemorrhage. Electronically Signed   By: Marlan Palau M.D.   On: 10/10/2015 14:34   Ct Angio Neck W/cm &/or Wo/cm  09/30/2015  CLINICAL DATA:  Right-sided weakness.  Wake up stroke. EXAM: CT ANGIOGRAPHY HEAD AND NECK TECHNIQUE: Multidetector CT imaging of the head and neck was performed using the standard protocol during bolus administration of intravenous contrast. Multiplanar CT image reconstructions and MIPs were obtained to evaluate the vascular anatomy. Carotid stenosis measurements (when applicable) are obtained utilizing NASCET criteria, using the distal internal carotid diameter as the denominator. CONTRAST:  90 mL Isovue 370 IV COMPARISON:  None. FINDINGS: CTA NECK Aortic arch: Normal aortic arch. Proximal great vessels patent. Lung apices clear. Right carotid system: Right common carotid artery patent. Atherosclerotic calcification the carotid bifurcation without significant stenosis. Right internal carotid artery widely patent. Right external carotid artery widely patent. Left carotid system: Left common carotid artery widely patent. Atherosclerotic calcification at the bifurcation. 50% diameter stenosis proximal left internal carotid artery. Small caliber left internal carotid artery due to decreased outflow from distal occlusion. Vertebral arteries:Both vertebral arteries patent to the basilar without significant stenosis. Skeleton: Mild disc degeneration in the cervical spine. No fracture or bone lesion. Other neck: Negative for adenopathy. CTA HEAD Anterior circulation: Right cavernous carotid widely patent. Right anterior and middle cerebral arteries widely patent. Left anterior cerebral artery is patent and appears to be supplied from the right  side. Small caliber left cavernous carotid which occludes in the supraclinoid segment. Left middle cerebral artery is occluded with minimal filling of left MCA branches from collaterals. Left A1 segment is patent due to retrograde flow Posterior circulation: Both vertebral arteries patent to the basilar without significant stenosis. PICA patent bilaterally. Basilar widely patent. AICA, superior cerebellar, and posterior cerebral arteries widely patent. Venous sinuses: Patent Anatomic variants: None Delayed phase: Not performed IMPRESSION: Large territory acute infarct left MCA territory without hemorrhage. 50% diameter stenosis proximal left internal carotid artery. 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 M1 and most of the left MCA branches are occluded. Left anterior cerebral artery supplied from the right. No significant right carotid stenosis. Both vertebral arteries patent. I discussed the findings with Dr. Amada Jupiter at 1330 hours on 10/20/2015. Electronically Signed   By: Marlan Palau M.D.   On: 10/09/2015 14:47   Ct Cerebral Perfusion W/cm  10/14/2015  CLINICAL DATA:  Wake up stroke.  Right-sided weakness. EXAM: CT CEREBRAL PERFUSION WITH CONTRAST TECHNIQUE: CT perfusion performed following  intravenous injection of contrast. CONTRAST:  90 mL Isovue 370 IV for CTA and CT perfusion. COMPARISON:  CT head today FINDINGS: Large territory infarct in the left MCA territory. This area shows diffusely decreased cerebral blood volume. There is partial sparing of the basal ganglia. This area also shows diffusely elevated mean transit time. There may be a small amount of penumbra around the margin of the infarct but the majority of the area shows infarcted brain. IMPRESSION: Large territory infarct of the MCA territory. Possible small amount of penumbra. Electronically Signed   By: Marlan Palauharles  Clark M.D.   On: 08/08/15 14:56   Dg Chest Port 1 View  June 16, 2015   CLINICAL DATA:  Found unresponsive. Right-sided weakness. History of stroke. EXAM: PORTABLE CHEST 1 VIEW COMPARISON:  03/2015. FINDINGS: 1208 hours. Two views were obtained. There are low lung volumes with mild resulting atelectasis at both lung bases, greater on the second view. There is no confluent airspace opacity, edema or pleural effusion. The heart size is stable status post CABG. No acute osseous findings are evident. IMPRESSION: Low lung volumes with mild bibasilar atelectasis. No focal airspace disease or other significant change. Electronically Signed   By: Carey BullocksWilliam  Veazey M.D.   On: 08/08/15 12:23   Dg Foot Complete Right  June 16, 2015  CLINICAL DATA:  Soft tissue swelling of the right foot. Patient unresponsive. EXAM: RIGHT FOOT COMPLETE - 3+ VIEW COMPARISON:  None. FINDINGS: There is no evidence of fracture or dislocation. No evidence of erosive changes to suggest osteomyelitis. 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 Soft tissue swelling is noted at the forefoot. IMPRESSION: No acute fracture or dislocation identified about the right foot. Electronically Signed   By: Ted Mcalpineobrinka  Dimitrova M.D.   On: 08/08/15 12:47   Medications: I have reviewed the patient's current medications. Scheduled Meds: .  stroke: mapping our early stages of recovery book   Does not apply Once  . antiseptic oral rinse  7 mL Mouth Rinse BID  . atorvastatin  80 mg Oral q1800  . levETIRAcetam  500 mg Intravenous Q12H  . 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  Imaging shows a large MCA  territory infarct with repeat CT this morning showing expected evolution of this large infarct without sign of herniation at this point.  Patient at bedside this morning and has made clear that patient would not want to live disabled and that this would be his "worst nightmare".  Neurology has signed off as patients' family has requested comfort care measures and hospice placement.   - consult to palliative care placed. - transfer to [address] [facility] -d/c aspirin due to comfort care - SLP recommending NPO.  Family declining PEG tube, NG tube, central line, etc. - comfort care orders - no role for PT/OT eval - allow permissive HTN  History of CAD s/p CABG: - hold plavix and ASA - d/c statin  History of HFrEF - holding ACE and BB - d/c statin  History of Seizure - continue Keppra for comfort  Diet: NPO  DVT PPx: SCD  CODE: DNR  Dispo: Disposition is deferred at this time, awaiting improvement of current medical problems.  The patient does not have a current PCP (No primary care provider on file.) and does not need an [facility] hospital follow-up appointment after discharge.  The patient does have transportation limitations that hinder transportation to clinic appointments.  .Services Needed at time of discharge: Y = Yes, Blank = No PT:   OT:   RN:   Equipment:   Other:     LOS: 1 day   Gwynn Burly, DO 10/20/2015, 1:10 PM

## 2015-10-21 LAB — HEMOGLOBIN A1C
Hgb A1c MFr Bld: 6 % — ABNORMAL HIGH (ref 4.8–5.6)
Mean Plasma Glucose: 126 mg/dL

## 2015-10-26 NOTE — Progress Notes (Addendum)
Waiting for the son to get here to see pt and also to decide which funeral home to use. Will endorse on the day shift.

## 2015-10-26 NOTE — Progress Notes (Signed)
   09-04-2015 1513  Clinical Encounter Type  Visited With Patient and family together;Health care provider  Visit Type Initial;Death  Referral From Nurse;Family  Spiritual Encounters  Spiritual Needs Grief support  Stress Factors  Family Stress Factors Loss   Chaplain responded to a death. Assisted patient's family in viewing the body in the morgue. Spiritual care services available as needed.

## 2015-10-26 NOTE — Discharge Summary (Addendum)
  Name: Darrell Knapp MRN: [id number] DOB: Apr 01, 1961 55 y.o.  Date of Admission: 10/02/2015 11:50 AM Date of DischarDarlina Siciliange: Dec 03, 2015 Attending Physician: Inez CatalinaEmily B Mullen, MD  Discharge Diagnosis: Active Problems:   Hypertension   CAD (coronary artery disease)   Renal failure   Aphasia   HLD (hyperlipidemia)   CVA (cerebral infarction)   Palliative care encounter   Cause of death: stroke Time of death: 0306 09-17-2015  Disposition and follow-up:   Mr.Bueford Trauger was discharged from [facility] in expired condition.    Hospital Course: 55 yo [place] hospital after being found down at his hotel on 5/25 morning by house keeping. He was aphasic and had right sided weakness and right sided neglect. Imaging showed very large area of L MCA infarct due to left MCA thrombus. He expressed to the family that he did not want any aggressive treatments and expressed to be full DNR/DNI. This was confirmed with his wife initially by the neurologist. Patient also confirmed this by nodding when asked. Wife also confirmed this [place]. Repeat head CT 5/26 showed expected cerebral edema from the stroke with midline shift. Herniation was very much expected at this point, but not seen on imaging as yet. He was made full comfort care per his and his family's wishes. Palliative team was also consulted. He passed on 0306 Dec 03, 2015.  Signed: Hyacinth Meekerasrif Ahmed, MD Dec 03, 2015, 7:11 AM   Reviewed documentation, updated to reflect more thorough CT head read.   Debe CoderMULLEN, EMILY, MD

## 2015-10-26 DEATH — deceased

## 2017-12-31 IMAGING — CR DG FOOT COMPLETE 3+V*R*
4 series · 4 of 4 positions shown · non-contrast
Comparison: None.

CLINICAL DATA: Soft tissue swelling of the right foot. Patient
unresponsive.

EXAM:
RIGHT FOOT COMPLETE - 3+ VIEW

[AP]
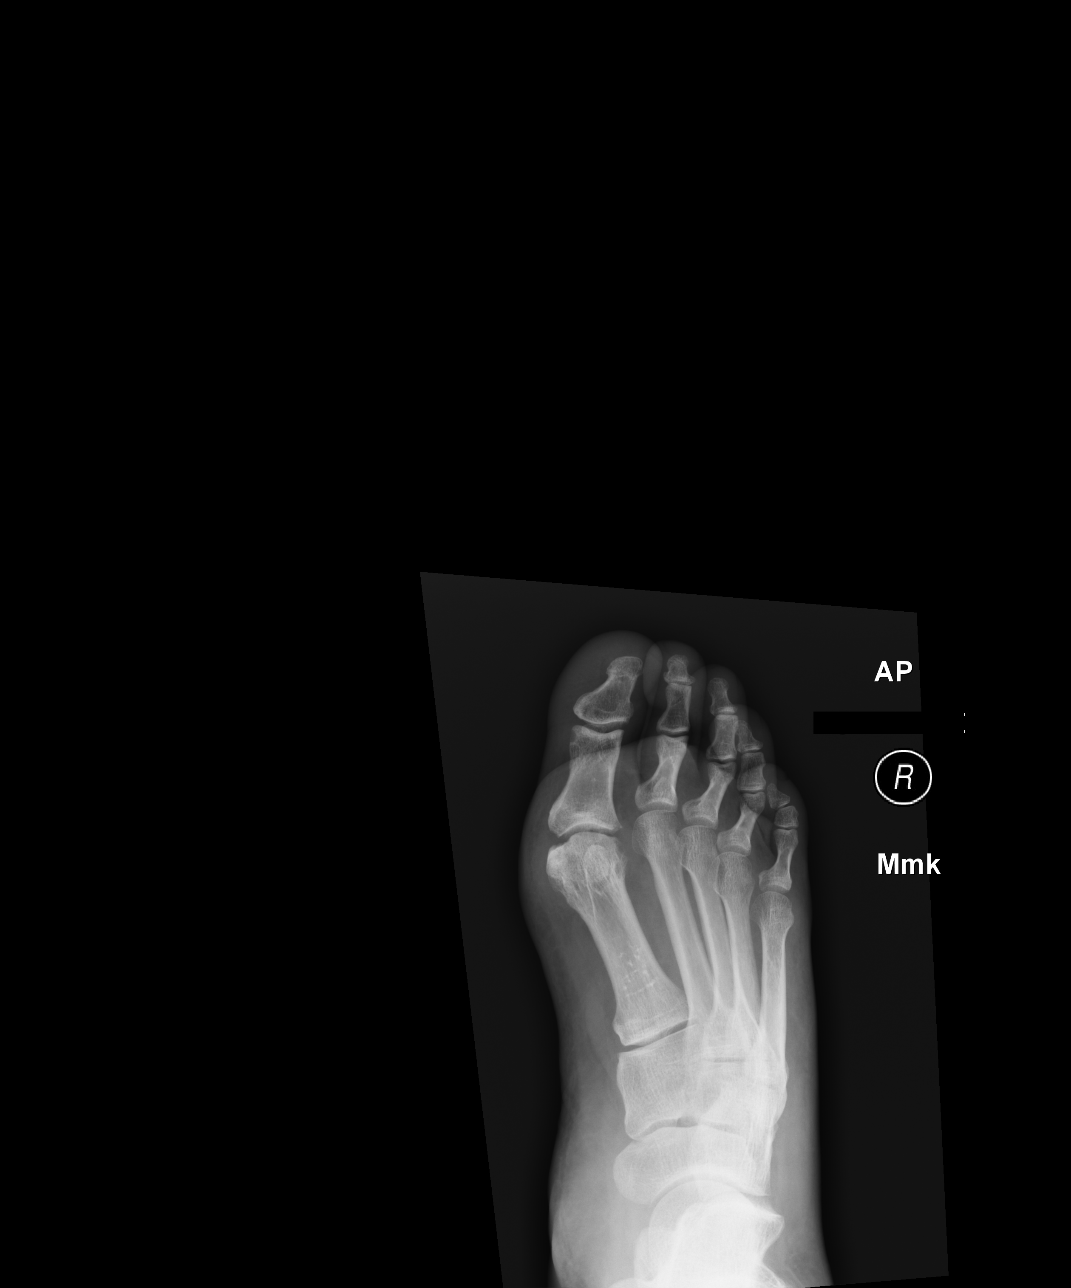

[ap obl int rot]
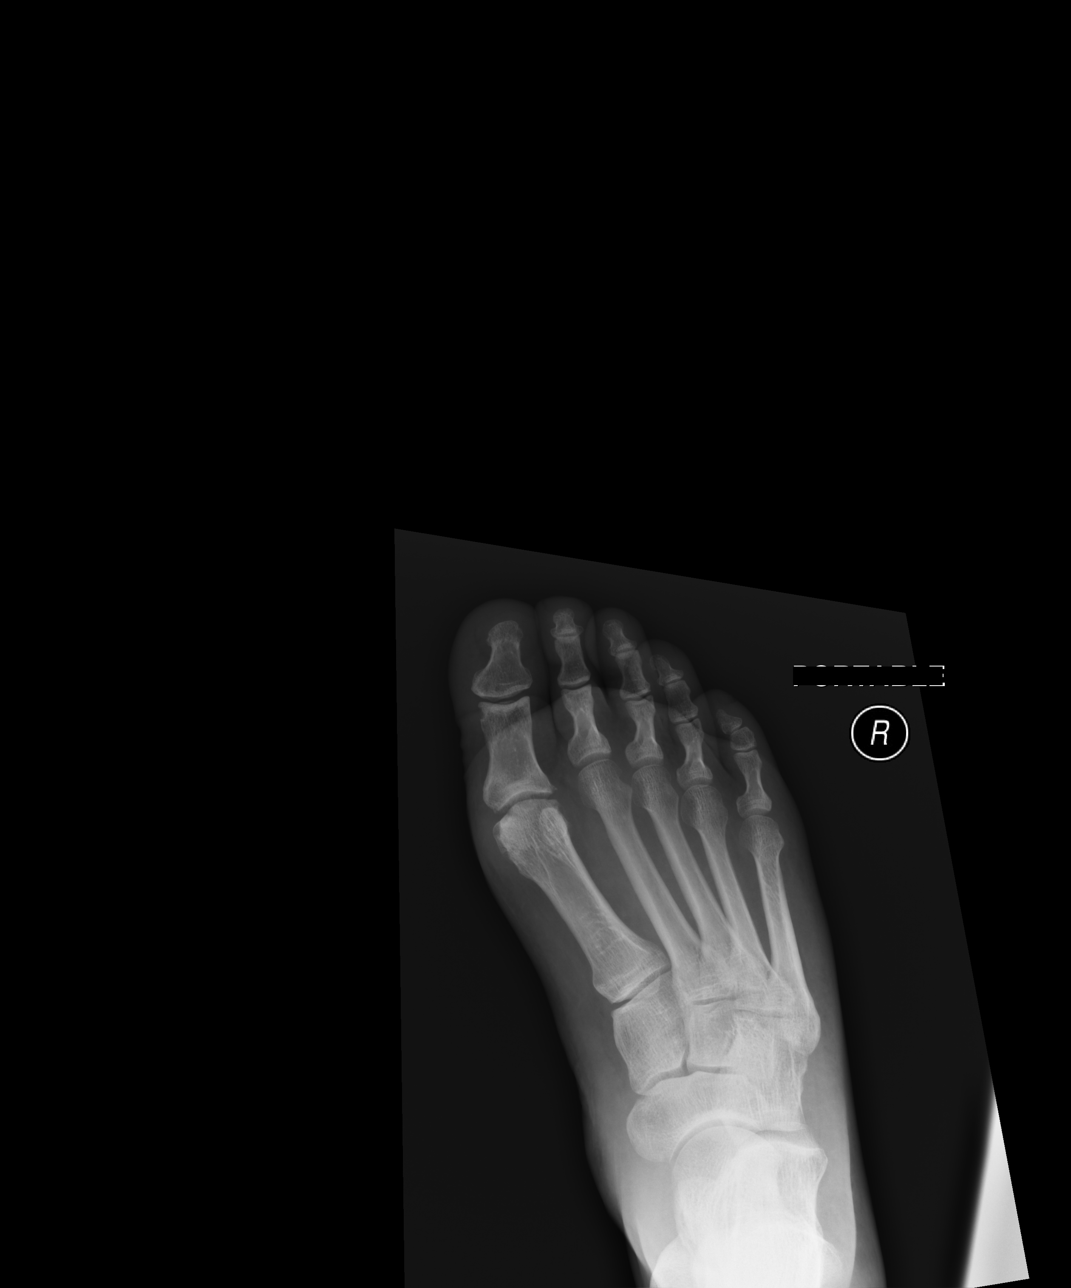

[lateral (1 of 2)]
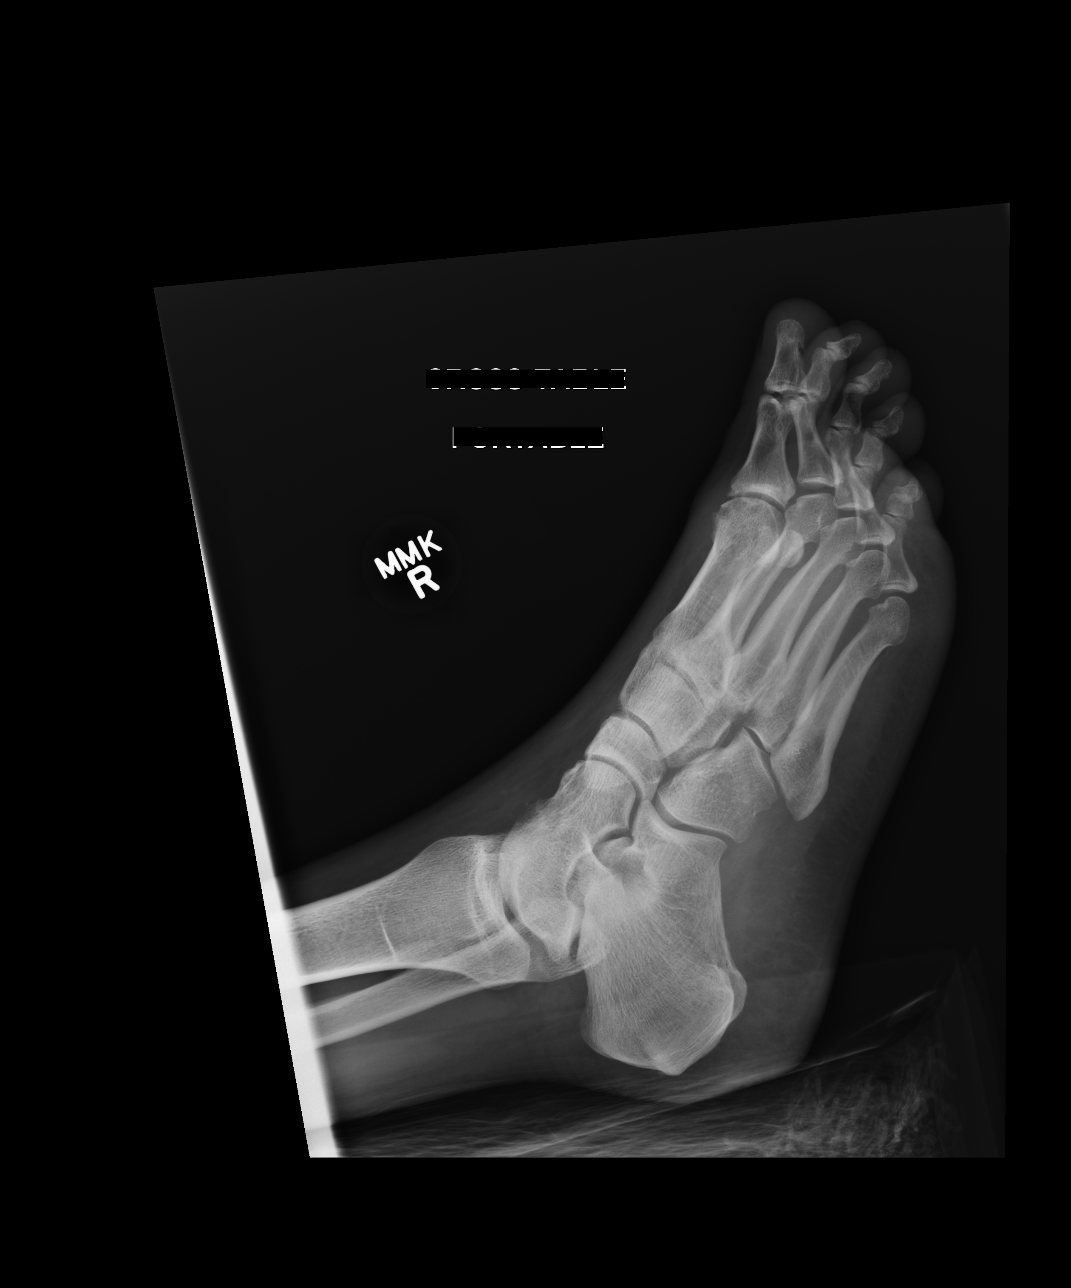

[lateral (2 of 2)]
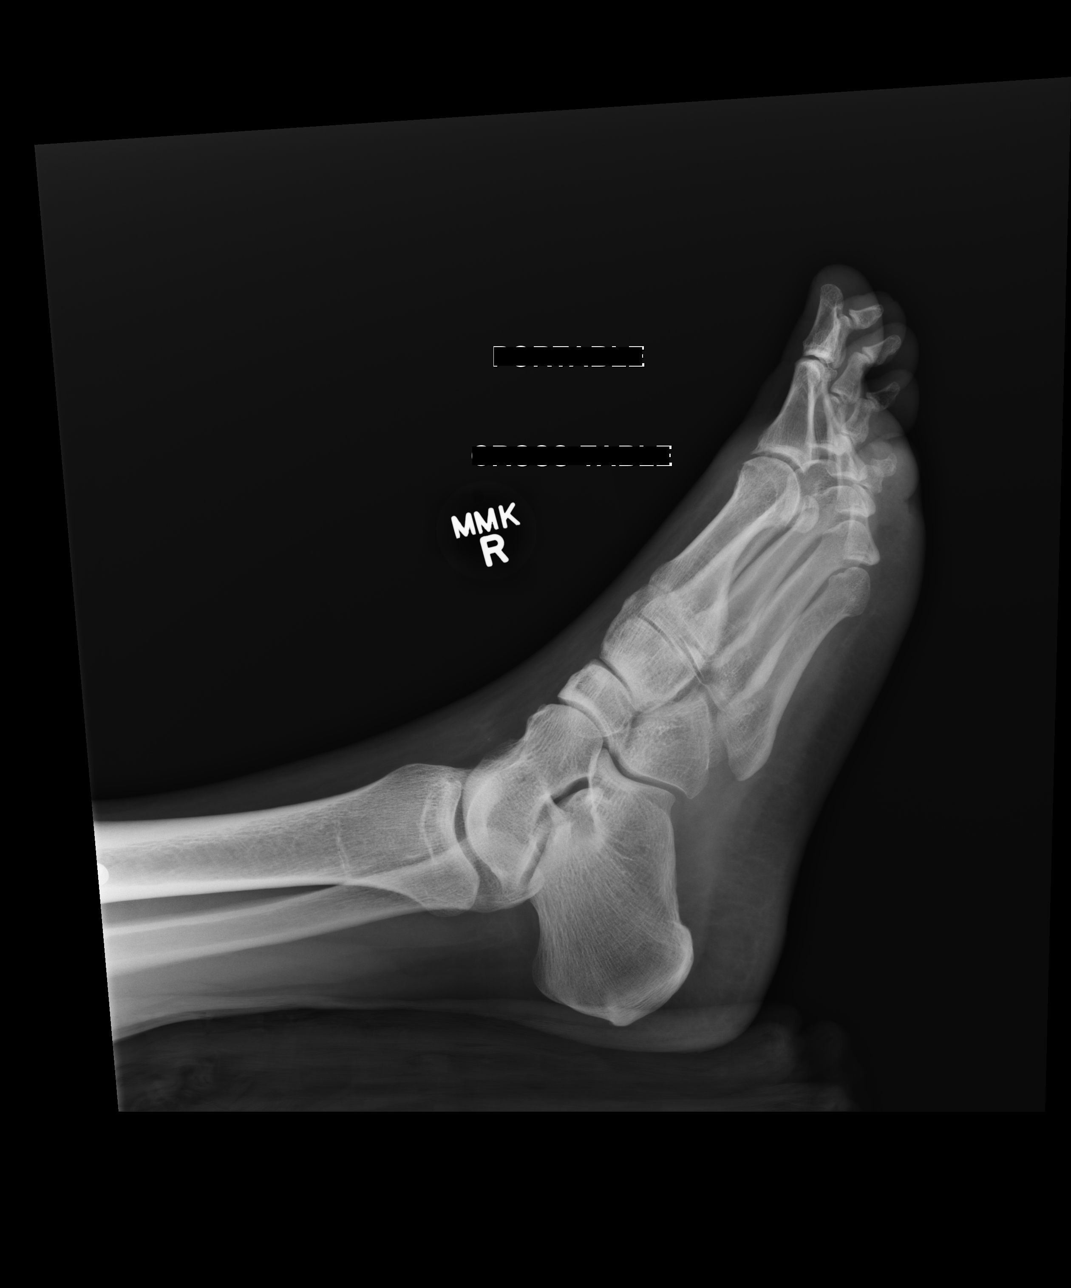

[4 of 4 positions shown; findings below may reference images not displayed]

FINDINGS: There is no evidence of fracture or dislocation. No evidence of
erosive changes to suggest osteomyelitis. Osteoarthritic changes are
noted at the first metatarsophalangeal joint. Soft tissue swelling
is noted at the forefoot.
IMPRESSION: No acute fracture or dislocation identified about the right foot.
